# Patient Record
Sex: Female | Born: 1945 | Race: White | Hispanic: No | Marital: Married | Smoking: Current every day smoker
Health system: Southern US, Community
[De-identification: ages and names within clinical notes are randomized; demographics above are authoritative.]

## PROBLEM LIST (undated history)

## (undated) DIAGNOSIS — I10 Essential (primary) hypertension: Secondary | ICD-10-CM

## (undated) DIAGNOSIS — K219 Gastro-esophageal reflux disease without esophagitis: Secondary | ICD-10-CM

## (undated) DIAGNOSIS — G5760 Lesion of plantar nerve, unspecified lower limb: Secondary | ICD-10-CM

## (undated) DIAGNOSIS — R51 Headache: Secondary | ICD-10-CM

## (undated) DIAGNOSIS — M1990 Unspecified osteoarthritis, unspecified site: Secondary | ICD-10-CM

## (undated) HISTORY — PX: TONSILLECTOMY: SUR1361

## (undated) HISTORY — PX: ABDOMINAL HYSTERECTOMY: SHX81

## (undated) HISTORY — PX: CARPAL TUNNEL RELEASE: SHX101

## (undated) HISTORY — PX: LUMBAR PUNCTURE: SHX1985

---

## 1997-11-30 ENCOUNTER — Encounter: Payer: Self-pay | Admitting: Obstetrics and Gynecology

## 1997-11-30 ENCOUNTER — Ambulatory Visit (HOSPITAL_COMMUNITY): Admission: RE | Admit: 1997-11-30 | Discharge: 1997-11-30 | Payer: Self-pay | Admitting: Obstetrics and Gynecology

## 1997-12-09 ENCOUNTER — Ambulatory Visit (HOSPITAL_COMMUNITY): Admission: RE | Admit: 1997-12-09 | Discharge: 1997-12-09 | Payer: Self-pay | Admitting: Obstetrics and Gynecology

## 1998-04-04 ENCOUNTER — Other Ambulatory Visit: Admission: RE | Admit: 1998-04-04 | Discharge: 1998-04-04 | Payer: Self-pay | Admitting: Obstetrics and Gynecology

## 1998-06-23 ENCOUNTER — Ambulatory Visit (HOSPITAL_COMMUNITY): Admission: RE | Admit: 1998-06-23 | Discharge: 1998-06-23 | Payer: Self-pay | Admitting: Obstetrics and Gynecology

## 1998-06-23 ENCOUNTER — Ambulatory Visit (HOSPITAL_BASED_OUTPATIENT_CLINIC_OR_DEPARTMENT_OTHER): Admission: RE | Admit: 1998-06-23 | Discharge: 1998-06-23 | Payer: Self-pay | Admitting: Orthopedic Surgery

## 1998-06-23 ENCOUNTER — Encounter: Payer: Self-pay | Admitting: Obstetrics and Gynecology

## 1998-12-08 HISTORY — PX: OTHER SURGICAL HISTORY: SHX169

## 1999-01-04 ENCOUNTER — Encounter: Payer: Self-pay | Admitting: Obstetrics and Gynecology

## 1999-01-04 ENCOUNTER — Ambulatory Visit (HOSPITAL_COMMUNITY): Admission: RE | Admit: 1999-01-04 | Discharge: 1999-01-04 | Payer: Self-pay | Admitting: Obstetrics and Gynecology

## 1999-01-10 ENCOUNTER — Encounter: Payer: Self-pay | Admitting: Obstetrics and Gynecology

## 1999-01-10 ENCOUNTER — Ambulatory Visit (HOSPITAL_COMMUNITY): Admission: RE | Admit: 1999-01-10 | Discharge: 1999-01-10 | Payer: Self-pay | Admitting: Obstetrics and Gynecology

## 1999-03-16 ENCOUNTER — Ambulatory Visit (HOSPITAL_BASED_OUTPATIENT_CLINIC_OR_DEPARTMENT_OTHER): Admission: RE | Admit: 1999-03-16 | Discharge: 1999-03-17 | Payer: Self-pay | Admitting: Orthopedic Surgery

## 1999-05-31 ENCOUNTER — Other Ambulatory Visit: Admission: RE | Admit: 1999-05-31 | Discharge: 1999-05-31 | Payer: Self-pay | Admitting: Obstetrics and Gynecology

## 2000-01-24 ENCOUNTER — Encounter: Payer: Self-pay | Admitting: Obstetrics and Gynecology

## 2000-01-24 ENCOUNTER — Ambulatory Visit (HOSPITAL_COMMUNITY): Admission: RE | Admit: 2000-01-24 | Discharge: 2000-01-24 | Payer: Self-pay | Admitting: Obstetrics and Gynecology

## 2000-07-08 ENCOUNTER — Other Ambulatory Visit: Admission: RE | Admit: 2000-07-08 | Discharge: 2000-07-08 | Payer: Self-pay | Admitting: Obstetrics and Gynecology

## 2001-02-10 ENCOUNTER — Ambulatory Visit (HOSPITAL_COMMUNITY): Admission: RE | Admit: 2001-02-10 | Discharge: 2001-02-10 | Payer: Self-pay | Admitting: Obstetrics and Gynecology

## 2001-02-10 ENCOUNTER — Encounter: Payer: Self-pay | Admitting: Obstetrics and Gynecology

## 2001-07-28 ENCOUNTER — Encounter: Payer: Self-pay | Admitting: Endocrinology

## 2001-07-28 ENCOUNTER — Encounter: Admission: RE | Admit: 2001-07-28 | Discharge: 2001-07-28 | Payer: Self-pay | Admitting: Endocrinology

## 2001-09-22 ENCOUNTER — Other Ambulatory Visit: Admission: RE | Admit: 2001-09-22 | Discharge: 2001-09-22 | Payer: Self-pay | Admitting: Obstetrics and Gynecology

## 2002-05-27 ENCOUNTER — Encounter: Payer: Self-pay | Admitting: Internal Medicine

## 2002-05-27 ENCOUNTER — Encounter: Admission: RE | Admit: 2002-05-27 | Discharge: 2002-05-27 | Payer: Self-pay | Admitting: Internal Medicine

## 2002-06-16 ENCOUNTER — Encounter: Payer: Self-pay | Admitting: Obstetrics and Gynecology

## 2002-06-16 ENCOUNTER — Ambulatory Visit (HOSPITAL_COMMUNITY): Admission: RE | Admit: 2002-06-16 | Discharge: 2002-06-16 | Payer: Self-pay | Admitting: Internal Medicine

## 2002-12-16 ENCOUNTER — Ambulatory Visit (HOSPITAL_COMMUNITY): Admission: RE | Admit: 2002-12-16 | Discharge: 2002-12-16 | Payer: Self-pay | Admitting: Neurology

## 2003-07-27 ENCOUNTER — Ambulatory Visit (HOSPITAL_COMMUNITY): Admission: RE | Admit: 2003-07-27 | Discharge: 2003-07-27 | Payer: Self-pay | Admitting: Obstetrics and Gynecology

## 2003-09-30 ENCOUNTER — Other Ambulatory Visit: Admission: RE | Admit: 2003-09-30 | Discharge: 2003-09-30 | Payer: Self-pay | Admitting: Obstetrics and Gynecology

## 2004-12-03 ENCOUNTER — Ambulatory Visit (HOSPITAL_COMMUNITY): Admission: RE | Admit: 2004-12-03 | Discharge: 2004-12-03 | Payer: Self-pay | Admitting: Obstetrics and Gynecology

## 2005-03-14 ENCOUNTER — Emergency Department (HOSPITAL_COMMUNITY): Admission: EM | Admit: 2005-03-14 | Discharge: 2005-03-14 | Payer: Self-pay | Admitting: Emergency Medicine

## 2005-08-07 ENCOUNTER — Encounter: Payer: Self-pay | Admitting: Obstetrics and Gynecology

## 2005-10-30 ENCOUNTER — Ambulatory Visit (HOSPITAL_BASED_OUTPATIENT_CLINIC_OR_DEPARTMENT_OTHER): Admission: RE | Admit: 2005-10-30 | Discharge: 2005-10-30 | Payer: Self-pay | Admitting: Orthopedic Surgery

## 2006-01-15 ENCOUNTER — Ambulatory Visit (HOSPITAL_COMMUNITY): Admission: RE | Admit: 2006-01-15 | Discharge: 2006-01-15 | Payer: Self-pay | Admitting: Obstetrics and Gynecology

## 2007-01-20 ENCOUNTER — Ambulatory Visit (HOSPITAL_COMMUNITY): Admission: RE | Admit: 2007-01-20 | Discharge: 2007-01-20 | Payer: Self-pay | Admitting: Obstetrics and Gynecology

## 2008-04-19 ENCOUNTER — Ambulatory Visit (HOSPITAL_COMMUNITY): Admission: RE | Admit: 2008-04-19 | Discharge: 2008-04-19 | Payer: Self-pay | Admitting: Obstetrics and Gynecology

## 2008-08-24 ENCOUNTER — Ambulatory Visit (HOSPITAL_COMMUNITY): Admission: RE | Admit: 2008-08-24 | Discharge: 2008-08-24 | Payer: Self-pay | Admitting: Chiropractic Medicine

## 2009-05-03 ENCOUNTER — Ambulatory Visit (HOSPITAL_COMMUNITY): Admission: RE | Admit: 2009-05-03 | Discharge: 2009-05-03 | Payer: Self-pay | Admitting: Obstetrics and Gynecology

## 2009-05-10 ENCOUNTER — Encounter: Admission: RE | Admit: 2009-05-10 | Discharge: 2009-05-10 | Payer: Self-pay | Admitting: Obstetrics and Gynecology

## 2010-05-10 ENCOUNTER — Other Ambulatory Visit (HOSPITAL_COMMUNITY): Payer: Self-pay | Admitting: Obstetrics and Gynecology

## 2010-05-10 DIAGNOSIS — Z139 Encounter for screening, unspecified: Secondary | ICD-10-CM

## 2010-05-10 DIAGNOSIS — Z1231 Encounter for screening mammogram for malignant neoplasm of breast: Secondary | ICD-10-CM

## 2010-06-04 ENCOUNTER — Ambulatory Visit (HOSPITAL_COMMUNITY)
Admission: RE | Admit: 2010-06-04 | Discharge: 2010-06-04 | Disposition: A | Discharge status: Home / Self Care | Payer: MEDICARE | Source: Ambulatory Visit | Attending: Obstetrics and Gynecology | Admitting: Obstetrics and Gynecology

## 2010-06-04 ENCOUNTER — Encounter (HOSPITAL_COMMUNITY): Payer: Self-pay

## 2010-06-04 DIAGNOSIS — Z1231 Encounter for screening mammogram for malignant neoplasm of breast: Secondary | ICD-10-CM | POA: Insufficient documentation

## 2010-08-24 NOTE — Op Note (Signed)
   NAME:  Tanya Bates, Tanya Bates                     ACCOUNT NO.:  [id number]   MEDICAL RECORD NO.:  [id number]                   PATIENT TYPE:  OUT   LOCATION:  [facility]                                  FACILITY:  [facility]   PHYSICIAN:  Genene Churn. Love, M.D.                 DATE OF BIRTH:  Nov 22, 1945   DATE OF PROCEDURE:  12/16/2002  DATE OF DISCHARGE:                                 OPERATIVE REPORT   PROCEDURE PERFORMED:  Lumbar puncture.   DESCRIPTION OF PROCEDURE:  The patient was prepped and draped in left  lateral decubitus position using Betadine and 1% Xylocaine.  The L3-L4  interspace was entered without difficulty.  Opening pressure was 100 mmH2O.  Clear, colorless CSF was obtained and sent for VDRL, protein, glucose, cell  count, differential, IgG oligoclonal IgG 1433 protein and beta amyloid 42  protein and tau protein.  The patient tolerated the procedure well.                                               Genene Churn. Sandria Manly, M.D.    JML/MEDQ  D:  12/16/2002  T:  12/17/2002  Job:  [id number]

## 2010-08-24 NOTE — Op Note (Signed)
Tanya Bates, Tanya Bates           ACCOUNT NO.:  [id number]   MEDICAL RECORD NO.:  [id number]          PATIENT TYPE:  AMB   LOCATION:  [facility]                          FACILITY:  [facility]   PHYSICIAN:  Artist Pais. Weingold, M.D.DATE OF BIRTH:  [birth year]   DATE OF PROCEDURE:  DATE OF DISCHARGE:                                 OPERATIVE REPORT   PREOPERATIVE DIAGNOSIS:  Right thumb carpometacarpal arthritis.   POSTOPERATIVE DIAGNOSIS:  Right thumb carpometacarpal arthritis.   PROCEDURE:  Right thumb carpometacarpal suspension plasty with abductor  pollicis longus tendon transfer.   SURGEON:  Artist Pais. Mina Marble, M.D.   ASSISTANT:  None.   ANESTHESIA:  General.   TOURNIQUET TIME:  One hour.   COMPLICATIONS:  None.   OPERATIVE PROCEDURE:  Patient was taken to the operating room, after the  induction of adequate general anesthesia right restraint was prepped in the  usual sterile fashion.  As marked we exsanguinated limb tourniquet was  placed 250 mmHg at this point and time.  Incision was made over the [facility]  joint, right thumb 3 cm in length, skin was incised.  Superficial radial  nerve branches were identified.  The extensor tendon was retracted volarly.  The capsule overlying the CMC joint was __________ incised.  The trapezium  was then removed using curettes, Osteotone's and rongeurs.  After this was  done a [facility] synovectomy was performed.  Next a transosseous canal was made in  the base of the thumb metacarpal using an ul followed by sequential hand  drilling, exiting dorsally 2 cm in the joint line.  After this was done the  APL tendon was identified in the volar aspect of the wound it was then  traced through the first dorsal compartment under the skin bridge to the  musculotendinous junction.  After this was done a second incision was made  at the musculotendinous junction.  A 7th of the APL tendon was incised at  the musculotendinous junction drawing of the distal wound and then  passed  from dorsal lower out the thumb metacarpal wrapped around the flexor cup  radialis tendon twice sutured with 2-0 fiber wire and then to itself  dorsally.  Once this was done the suspension was completed.  The wound was  then thoroughly irrigated.  The capsule around the seem shunt was closed  with 4-0 Vicryl and both incision with 3-0 Prolene subcuticular stitches.  Steri-strips were for sloughs and radial guard splint was applied.  Patient  tolerated procedure well and __________ stable fashion.      Artist Pais Mina Marble, M.D.  Electronically Signed    MAW/MEDQ  D:  10/30/2005  T:  10/31/2005  Job:  [id number]

## 2011-05-02 ENCOUNTER — Other Ambulatory Visit (HOSPITAL_COMMUNITY): Payer: Self-pay | Admitting: Obstetrics and Gynecology

## 2011-05-02 DIAGNOSIS — Z1231 Encounter for screening mammogram for malignant neoplasm of breast: Secondary | ICD-10-CM

## 2011-06-06 ENCOUNTER — Ambulatory Visit (HOSPITAL_COMMUNITY)
Admission: RE | Admit: 2011-06-06 | Discharge: 2011-06-06 | Disposition: A | Discharge status: Home / Self Care | Payer: Medicare Other | Source: Ambulatory Visit | Attending: Obstetrics and Gynecology | Admitting: Obstetrics and Gynecology

## 2011-06-06 DIAGNOSIS — Z1231 Encounter for screening mammogram for malignant neoplasm of breast: Secondary | ICD-10-CM | POA: Insufficient documentation

## 2012-03-17 ENCOUNTER — Other Ambulatory Visit: Payer: Self-pay | Admitting: Neurosurgery

## 2012-04-24 ENCOUNTER — Encounter (HOSPITAL_COMMUNITY): Payer: Self-pay

## 2012-04-24 ENCOUNTER — Encounter (HOSPITAL_COMMUNITY)
Admission: RE | Admit: 2012-04-24 | Discharge: 2012-04-24 | Disposition: A | Discharge status: Home / Self Care | Payer: Medicare Other | Source: Ambulatory Visit | Attending: Neurosurgery | Admitting: Neurosurgery

## 2012-04-24 HISTORY — DX: Gastro-esophageal reflux disease without esophagitis: K21.9

## 2012-04-24 HISTORY — DX: Unspecified osteoarthritis, unspecified site: M19.90

## 2012-04-24 HISTORY — DX: Essential (primary) hypertension: I10

## 2012-04-24 HISTORY — DX: Headache: R51

## 2012-04-24 HISTORY — DX: Lesion of plantar nerve, unspecified lower limb: G57.60

## 2012-04-24 LAB — CBC
HCT: 40.7 % (ref 36.0–46.0)
MCH: 33.7 pg (ref 26.0–34.0)
MCV: 96.7 fL (ref 78.0–100.0)
RBC: 4.21 MIL/uL (ref 3.87–5.11)
RDW: 12.1 % (ref 11.5–15.5)
WBC: 5.1 10*3/uL (ref 4.0–10.5)

## 2012-04-24 LAB — ABO/RH: ABO/RH(D): O POS

## 2012-04-24 LAB — BASIC METABOLIC PANEL
CO2: 22 mEq/L (ref 19–32)
Calcium: 10.5 mg/dL (ref 8.4–10.5)
Chloride: 103 mEq/L (ref 96–112)
Creatinine, Ser: 1.13 mg/dL — ABNORMAL HIGH (ref 0.50–1.10)
Glucose, Bld: 96 mg/dL (ref 70–99)

## 2012-04-24 LAB — TYPE AND SCREEN

## 2012-04-24 LAB — SURGICAL PCR SCREEN: Staphylococcus aureus: NEGATIVE

## 2012-04-24 NOTE — Pre-Procedure Instructions (Signed)
Tanya Bates  04/24/2012   Your procedure is scheduled on:  Monday May 04, 2012  Report to [facility] at 0530 AM.  Call this number if you have problems the morning of surgery: [phone]   Remember:   Do not eat food or drink liquids after midnight.   Take these medicines the morning of surgery with A SIP OF WATER: Amlodipine, Wellbutrin, and Lyrica   Do not wear jewelry, make-up or nail polish.  Do not wear lotions, powders, or perfumes. You may wear deodorant.  Do not shave 48 hours prior to surgery.   Do not bring valuables to the hospital.  Contacts, dentures or bridgework may not be worn into surgery.  Leave suitcase in the car. After surgery it may be brought to your room.  For patients admitted to the hospital, checkout time is 11:00 AM the day of  discharge.   Patients discharged the day of surgery will not be allowed to drive  home.    Special Instructions: Shower using CHG 2 nights before surgery and the night before surgery.  If you shower the day of surgery use CHG.  Use special wash - you have one bottle of CHG for all showers.  You should use approximately 1/3 of the bottle for each shower.   Please read over the following fact sheets that you were given: Pain Booklet, Coughing and Deep Breathing, MRSA Information and Surgical Site Infection Prevention

## 2012-05-03 MED ORDER — CEFAZOLIN SODIUM-DEXTROSE 2-3 GM-% IV SOLR
2.0000 g | INTRAVENOUS | Status: DC
  Filled 2012-05-03: qty 50

## 2012-05-04 ENCOUNTER — Inpatient Hospital Stay (HOSPITAL_COMMUNITY): Payer: Medicare Other

## 2012-05-04 ENCOUNTER — Inpatient Hospital Stay (HOSPITAL_COMMUNITY): Payer: Medicare Other | Admitting: Certified Registered"

## 2012-05-04 ENCOUNTER — Encounter (HOSPITAL_COMMUNITY): Payer: Self-pay | Admitting: Certified Registered"

## 2012-05-04 ENCOUNTER — Encounter (HOSPITAL_COMMUNITY): Payer: Self-pay | Admitting: General Practice

## 2012-05-04 ENCOUNTER — Encounter (HOSPITAL_COMMUNITY)
Admission: RE | Disposition: A | Discharge status: Home / Self Care | Payer: Self-pay | Source: Ambulatory Visit | Attending: Neurosurgery

## 2012-05-04 ENCOUNTER — Inpatient Hospital Stay (HOSPITAL_COMMUNITY)
Admission: RE | Admit: 2012-05-04 | Discharge: 2012-05-06 | DRG: 460 | Disposition: A | Discharge status: Home / Self Care | Payer: Medicare Other | Source: Ambulatory Visit | Attending: Neurosurgery | Admitting: Neurosurgery

## 2012-05-04 DIAGNOSIS — Z79899 Other long term (current) drug therapy: Secondary | ICD-10-CM

## 2012-05-04 DIAGNOSIS — M48061 Spinal stenosis, lumbar region without neurogenic claudication: Secondary | ICD-10-CM | POA: Diagnosis present

## 2012-05-04 DIAGNOSIS — G5760 Lesion of plantar nerve, unspecified lower limb: Secondary | ICD-10-CM | POA: Diagnosis present

## 2012-05-04 DIAGNOSIS — Q762 Congenital spondylolisthesis: Principal | ICD-10-CM

## 2012-05-04 DIAGNOSIS — I10 Essential (primary) hypertension: Secondary | ICD-10-CM | POA: Diagnosis present

## 2012-05-04 DIAGNOSIS — F17200 Nicotine dependence, unspecified, uncomplicated: Secondary | ICD-10-CM | POA: Diagnosis present

## 2012-05-04 DIAGNOSIS — M2489 Other specific joint derangement of other specified joint, not elsewhere classified: Secondary | ICD-10-CM | POA: Diagnosis present

## 2012-05-04 DIAGNOSIS — M129 Arthropathy, unspecified: Secondary | ICD-10-CM | POA: Diagnosis present

## 2012-05-04 DIAGNOSIS — K219 Gastro-esophageal reflux disease without esophagitis: Secondary | ICD-10-CM | POA: Diagnosis present

## 2012-05-04 HISTORY — PX: LUMBAR LAMINECTOMY: SHX95

## 2012-05-04 SURGERY — POSTERIOR LUMBAR FUSION 1 LEVEL
Anesthesia: General | Site: Spine Lumbar | Wound class: Clean

## 2012-05-04 MED ORDER — HYDROMORPHONE HCL PF 1 MG/ML IJ SOLN
INTRAMUSCULAR | Status: AC
  Filled 2012-05-04: qty 1

## 2012-05-04 MED ORDER — OXYCODONE HCL 5 MG PO TABS
5.0000 mg | ORAL_TABLET | Freq: Once | ORAL | Status: AC | PRN
  Administered 2012-05-04: 5 mg via ORAL

## 2012-05-04 MED ORDER — EPHEDRINE SULFATE 50 MG/ML IJ SOLN
INTRAMUSCULAR | Status: DC | PRN
  Administered 2012-05-04 (×2): 10 mg via INTRAVENOUS

## 2012-05-04 MED ORDER — CYCLOBENZAPRINE HCL 10 MG PO TABS
10.0000 mg | ORAL_TABLET | Freq: Three times a day (TID) | ORAL | Status: DC | PRN
  Administered 2012-05-04 – 2012-05-06 (×2): 10 mg via ORAL
  Filled 2012-05-04: qty 1

## 2012-05-04 MED ORDER — 0.9 % SODIUM CHLORIDE (POUR BTL) OPTIME
TOPICAL | Status: DC | PRN
  Administered 2012-05-04: 1000 mL

## 2012-05-04 MED ORDER — DEXAMETHASONE SODIUM PHOSPHATE 10 MG/ML IJ SOLN: 10.0000 mg | INTRAMUSCULAR | Status: DC

## 2012-05-04 MED ORDER — NEOSTIGMINE METHYLSULFATE 1 MG/ML IJ SOLN
INTRAMUSCULAR | Status: DC | PRN
  Administered 2012-05-04: 3 mg via INTRAVENOUS

## 2012-05-04 MED ORDER — PHENYLEPHRINE HCL 10 MG/ML IJ SOLN
INTRAMUSCULAR | Status: DC | PRN
  Administered 2012-05-04 (×5): 80 ug via INTRAVENOUS

## 2012-05-04 MED ORDER — ATORVASTATIN CALCIUM 20 MG PO TABS
20.0000 mg | ORAL_TABLET | Freq: Every day | ORAL | Status: DC
  Administered 2012-05-04 – 2012-05-05 (×2): 20 mg via ORAL
  Filled 2012-05-04 (×3): qty 1

## 2012-05-04 MED ORDER — ONDANSETRON HCL 4 MG/2ML IJ SOLN: 4.0000 mg | Freq: Four times a day (QID) | INTRAMUSCULAR | Status: DC | PRN

## 2012-05-04 MED ORDER — CYCLOBENZAPRINE HCL 10 MG PO TABS
ORAL_TABLET | ORAL | Status: AC
  Filled 2012-05-04: qty 1

## 2012-05-04 MED ORDER — OXYCODONE HCL 5 MG PO TABS
ORAL_TABLET | ORAL | Status: AC
  Filled 2012-05-04: qty 1

## 2012-05-04 MED ORDER — SODIUM CHLORIDE 0.9 % IV SOLN
250.0000 mL | INTRAVENOUS | Status: DC
  Administered 2012-05-04: 250 mL via INTRAVENOUS

## 2012-05-04 MED ORDER — CEFAZOLIN SODIUM 1-5 GM-% IV SOLN: 1.0000 g | Freq: Three times a day (TID) | INTRAVENOUS | Status: DC

## 2012-05-04 MED ORDER — ONDANSETRON HCL 4 MG/2ML IJ SOLN
INTRAMUSCULAR | Status: DC | PRN
  Administered 2012-05-04: 4 mg via INTRAVENOUS

## 2012-05-04 MED ORDER — OXYCODONE HCL 5 MG/5ML PO SOLN: 5.0000 mg | Freq: Once | ORAL | Status: AC | PRN

## 2012-05-04 MED ORDER — ACETAMINOPHEN 10 MG/ML IV SOLN
1000.0000 mg | Freq: Once | INTRAVENOUS | Status: AC
  Administered 2012-05-04: 1000 mg via INTRAVENOUS
  Filled 2012-05-04: qty 100

## 2012-05-04 MED ORDER — AMLODIPINE BESYLATE 5 MG PO TABS
5.0000 mg | ORAL_TABLET | Freq: Every day | ORAL | Status: DC
  Administered 2012-05-05: 5 mg via ORAL
  Filled 2012-05-04 (×2): qty 1

## 2012-05-04 MED ORDER — LIDOCAINE HCL (CARDIAC) 20 MG/ML IV SOLN
INTRAVENOUS | Status: DC | PRN
  Administered 2012-05-04: 70 mg via INTRAVENOUS

## 2012-05-04 MED ORDER — WHITE PETROLATUM GEL
Status: AC
  Administered 2012-05-04: 13:00:00
  Filled 2012-05-04: qty 5

## 2012-05-04 MED ORDER — ACETAMINOPHEN 325 MG PO TABS: 650.0000 mg | ORAL_TABLET | ORAL | Status: DC | PRN

## 2012-05-04 MED ORDER — SUFENTANIL CITRATE 50 MCG/ML IV SOLN
INTRAVENOUS | Status: DC | PRN
  Administered 2012-05-04: 5 ug via INTRAVENOUS
  Administered 2012-05-04: 20 ug via INTRAVENOUS
  Administered 2012-05-04: 10 ug via INTRAVENOUS
  Administered 2012-05-04: 5 ug via INTRAVENOUS

## 2012-05-04 MED ORDER — OXYCODONE-ACETAMINOPHEN 5-325 MG PO TABS
1.0000 | ORAL_TABLET | ORAL | Status: DC | PRN
  Administered 2012-05-05 – 2012-05-06 (×9): 2 via ORAL
  Filled 2012-05-04 (×9): qty 2

## 2012-05-04 MED ORDER — BACITRACIN 50000 UNITS IM SOLR
INTRAMUSCULAR | Status: AC
  Filled 2012-05-04: qty 1

## 2012-05-04 MED ORDER — DEXAMETHASONE SODIUM PHOSPHATE 10 MG/ML IJ SOLN
INTRAMUSCULAR | Status: AC
  Administered 2012-05-04: 10 mg via INTRAVENOUS
  Filled 2012-05-04: qty 1

## 2012-05-04 MED ORDER — BUPROPION HCL ER (SR) 150 MG PO TB12
150.0000 mg | ORAL_TABLET | Freq: Two times a day (BID) | ORAL | Status: DC
  Administered 2012-05-04 – 2012-05-06 (×4): 150 mg via ORAL
  Filled 2012-05-04 (×6): qty 1

## 2012-05-04 MED ORDER — SODIUM CHLORIDE 0.9 % IV SOLN
INTRAVENOUS | Status: AC
  Filled 2012-05-04: qty 500

## 2012-05-04 MED ORDER — HYDROCHLOROTHIAZIDE 12.5 MG PO CAPS
12.5000 mg | ORAL_CAPSULE | Freq: Every day | ORAL | Status: DC
  Administered 2012-05-05: 12.5 mg via ORAL
  Filled 2012-05-04 (×2): qty 1

## 2012-05-04 MED ORDER — VANCOMYCIN HCL 500 MG IV SOLR
500.0000 mg | INTRAVENOUS | Status: DC
  Administered 2012-05-05 – 2012-05-06 (×2): 500 mg via INTRAVENOUS
  Filled 2012-05-04 (×2): qty 500

## 2012-05-04 MED ORDER — SODIUM CHLORIDE 0.9 % IR SOLN
Status: DC | PRN
  Administered 2012-05-04: 09:00:00

## 2012-05-04 MED ORDER — PROPOFOL 10 MG/ML IV BOLUS
INTRAVENOUS | Status: DC | PRN
  Administered 2012-05-04: 140 mg via INTRAVENOUS

## 2012-05-04 MED ORDER — GLYCOPYRROLATE 0.2 MG/ML IJ SOLN
INTRAMUSCULAR | Status: DC | PRN
  Administered 2012-05-04: 0.4 mg via INTRAVENOUS

## 2012-05-04 MED ORDER — PREGABALIN 75 MG PO CAPS
75.0000 mg | ORAL_CAPSULE | Freq: Two times a day (BID) | ORAL | Status: DC
  Administered 2012-05-04 – 2012-05-06 (×5): 75 mg via ORAL
  Filled 2012-05-04 (×5): qty 1

## 2012-05-04 MED ORDER — DOCUSATE SODIUM 100 MG PO CAPS
100.0000 mg | ORAL_CAPSULE | Freq: Two times a day (BID) | ORAL | Status: DC
  Administered 2012-05-04 – 2012-05-05 (×4): 100 mg via ORAL
  Filled 2012-05-04 (×4): qty 1

## 2012-05-04 MED ORDER — BUPIVACAINE HCL (PF) 0.25 % IJ SOLN
INTRAMUSCULAR | Status: DC | PRN
  Administered 2012-05-04: 9 mL

## 2012-05-04 MED ORDER — PHENOL 1.4 % MT LIQD: 1.0000 | OROMUCOSAL | Status: DC | PRN

## 2012-05-04 MED ORDER — ROCURONIUM BROMIDE 100 MG/10ML IV SOLN
INTRAVENOUS | Status: DC | PRN
  Administered 2012-05-04: 20 mg via INTRAVENOUS
  Administered 2012-05-04: 50 mg via INTRAVENOUS

## 2012-05-04 MED ORDER — MENTHOL 3 MG MT LOZG: 1.0000 | LOZENGE | OROMUCOSAL | Status: DC | PRN

## 2012-05-04 MED ORDER — MIDAZOLAM HCL 5 MG/5ML IJ SOLN
INTRAMUSCULAR | Status: DC | PRN
  Administered 2012-05-04: 2 mg via INTRAVENOUS

## 2012-05-04 MED ORDER — HYDROMORPHONE HCL PF 1 MG/ML IJ SOLN
0.5000 mg | INTRAMUSCULAR | Status: DC | PRN
  Administered 2012-05-04 (×5): 1 mg via INTRAVENOUS
  Filled 2012-05-04 (×5): qty 1

## 2012-05-04 MED ORDER — ALBUMIN HUMAN 5 % IV SOLN
INTRAVENOUS | Status: DC | PRN
  Administered 2012-05-04: 09:00:00 via INTRAVENOUS

## 2012-05-04 MED ORDER — ALUM & MAG HYDROXIDE-SIMETH 200-200-20 MG/5ML PO SUSP
30.0000 mL | Freq: Four times a day (QID) | ORAL | Status: DC | PRN
  Administered 2012-05-04: 30 mL via ORAL
  Filled 2012-05-04: qty 30

## 2012-05-04 MED ORDER — LIDOCAINE-EPINEPHRINE 1 %-1:100000 IJ SOLN
INTRAMUSCULAR | Status: DC | PRN
  Administered 2012-05-04: 8 mL

## 2012-05-04 MED ORDER — LACTATED RINGERS IV SOLN
INTRAVENOUS | Status: DC | PRN
  Administered 2012-05-04 (×2): via INTRAVENOUS

## 2012-05-04 MED ORDER — SODIUM CHLORIDE 0.9 % IJ SOLN
3.0000 mL | Freq: Two times a day (BID) | INTRAMUSCULAR | Status: DC
  Administered 2012-05-05 (×2): 3 mL via INTRAVENOUS

## 2012-05-04 MED ORDER — ACETAMINOPHEN 650 MG RE SUPP: 650.0000 mg | RECTAL | Status: DC | PRN

## 2012-05-04 MED ORDER — VANCOMYCIN HCL 1000 MG IV SOLR
1000.0000 mg | INTRAVENOUS | Status: DC | PRN
  Administered 2012-05-04: 1000 mg via INTRAVENOUS

## 2012-05-04 MED ORDER — OLMESARTAN MEDOXOMIL-HCTZ 40-12.5 MG PO TABS: 1.0000 | ORAL_TABLET | Freq: Every day | ORAL | Status: DC

## 2012-05-04 MED ORDER — IRBESARTAN 300 MG PO TABS
300.0000 mg | ORAL_TABLET | Freq: Every day | ORAL | Status: DC
  Administered 2012-05-05 – 2012-05-06 (×2): 300 mg via ORAL
  Filled 2012-05-04 (×2): qty 1

## 2012-05-04 MED ORDER — HYDROMORPHONE HCL PF 1 MG/ML IJ SOLN
0.2500 mg | INTRAMUSCULAR | Status: DC | PRN
  Administered 2012-05-04 (×4): 0.5 mg via INTRAVENOUS

## 2012-05-04 MED ORDER — ONDANSETRON HCL 4 MG/2ML IJ SOLN: 4.0000 mg | INTRAMUSCULAR | Status: DC | PRN

## 2012-05-04 MED ORDER — SODIUM CHLORIDE 0.9 % IJ SOLN: 3.0000 mL | INTRAMUSCULAR | Status: DC | PRN

## 2012-05-04 MED ORDER — VANCOMYCIN HCL IN DEXTROSE 1-5 GM/200ML-% IV SOLN
INTRAVENOUS | Status: AC
  Filled 2012-05-04: qty 200

## 2012-05-04 MED ORDER — ACETAMINOPHEN 10 MG/ML IV SOLN
INTRAVENOUS | Status: AC
  Filled 2012-05-04: qty 100

## 2012-05-04 MED ORDER — THROMBIN 20000 UNITS EX SOLR
CUTANEOUS | Status: DC | PRN
  Administered 2012-05-04: 09:00:00 via TOPICAL

## 2012-05-04 MED ORDER — ACETAMINOPHEN 10 MG/ML IV SOLN
INTRAVENOUS | Status: DC | PRN
  Administered 2012-05-04: 1000 mg via INTRAVENOUS

## 2012-05-04 SURGICAL SUPPLY — 73 items
ADH SKN CLS APL DERMABOND .7 (GAUZE/BANDAGES/DRESSINGS)
ADH SKN CLS LQ APL DERMABOND (GAUZE/BANDAGES/DRESSINGS) ×1
APL SKNCLS STERI-STRIP NONHPOA (GAUZE/BANDAGES/DRESSINGS) ×1
BAG DECANTER FOR FLEXI CONT (MISCELLANEOUS) ×2 IMPLANT
BENZOIN TINCTURE PRP APPL 2/3 (GAUZE/BANDAGES/DRESSINGS) ×2 IMPLANT
BLADE SURG 11 STRL SS (BLADE) ×2 IMPLANT
BLADE SURG ROTATE 9660 (MISCELLANEOUS) IMPLANT
BRUSH SCRUB EZ PLAIN DRY (MISCELLANEOUS) ×2 IMPLANT
BUR MATCHSTICK NEURO 3.0 LAGG (BURR) ×2 IMPLANT
BUR PRECISION FLUTE 6.0 (BURR) ×2 IMPLANT
CANISTER SUCTION 2500CC (MISCELLANEOUS) ×2 IMPLANT
CAP LOCKING REVERE (Cap) ×4 IMPLANT
CLOTH BEACON ORANGE TIMEOUT ST (SAFETY) ×2 IMPLANT
CONT SPEC 4OZ CLIKSEAL STRL BL (MISCELLANEOUS) ×4 IMPLANT
COVER BACK TABLE 24X17X13 BIG (DRAPES) IMPLANT
COVER TABLE BACK 60X90 (DRAPES) ×2 IMPLANT
DECANTER SPIKE VIAL GLASS SM (MISCELLANEOUS) ×2 IMPLANT
DERMABOND ADHESIVE PROPEN (GAUZE/BANDAGES/DRESSINGS) ×1
DERMABOND ADVANCED (GAUZE/BANDAGES/DRESSINGS)
DERMABOND ADVANCED .7 DNX12 (GAUZE/BANDAGES/DRESSINGS) IMPLANT
DERMABOND ADVANCED .7 DNX6 (GAUZE/BANDAGES/DRESSINGS) IMPLANT
DRAPE C-ARM 42X72 X-RAY (DRAPES) ×4 IMPLANT
DRAPE LAPAROTOMY 100X72X124 (DRAPES) ×2 IMPLANT
DRAPE POUCH INSTRU U-SHP 10X18 (DRAPES) ×2 IMPLANT
DRAPE PROXIMA HALF (DRAPES) IMPLANT
DRAPE SURG 17X23 STRL (DRAPES) ×2 IMPLANT
DRSG OPSITE 4X5.5 SM (GAUZE/BANDAGES/DRESSINGS) ×4 IMPLANT
ELECT REM PT RETURN 9FT ADLT (ELECTROSURGICAL) ×2
ELECTRODE REM PT RTRN 9FT ADLT (ELECTROSURGICAL) ×1 IMPLANT
EVACUATOR 3/16  PVC DRAIN (DRAIN) ×1
EVACUATOR 3/16 PVC DRAIN (DRAIN) ×1 IMPLANT
GAUZE SPONGE 4X4 16PLY XRAY LF (GAUZE/BANDAGES/DRESSINGS) IMPLANT
GLOVE BIO SURGEON STRL SZ8 (GLOVE) ×4 IMPLANT
GLOVE BIOGEL PI IND STRL 7.0 (GLOVE) IMPLANT
GLOVE BIOGEL PI INDICATOR 7.0 (GLOVE) ×4
GLOVE ECLIPSE 7.5 STRL STRAW (GLOVE) IMPLANT
GLOVE ECLIPSE 8.5 STRL (GLOVE) ×1 IMPLANT
GLOVE EXAM NITRILE LRG STRL (GLOVE) IMPLANT
GLOVE EXAM NITRILE MD LF STRL (GLOVE) IMPLANT
GLOVE EXAM NITRILE XL STR (GLOVE) IMPLANT
GLOVE EXAM NITRILE XS STR PU (GLOVE) IMPLANT
GLOVE INDICATOR 8.5 STRL (GLOVE) ×4 IMPLANT
GLOVE SS BIOGEL STRL SZ 6.5 (GLOVE) ×2 IMPLANT
GLOVE SUPERSENSE BIOGEL SZ 6.5 (GLOVE) ×2
GOWN BRE IMP SLV AUR LG STRL (GOWN DISPOSABLE) ×1 IMPLANT
GOWN BRE IMP SLV AUR XL STRL (GOWN DISPOSABLE) ×5 IMPLANT
GOWN STRL REIN 2XL LVL4 (GOWN DISPOSABLE) IMPLANT
KIT BASIN OR (CUSTOM PROCEDURE TRAY) ×2 IMPLANT
KIT ROOM TURNOVER OR (KITS) ×2 IMPLANT
MILL MEDIUM DISP (BLADE) ×2 IMPLANT
NDL HYPO 25X1 1.5 SAFETY (NEEDLE) ×1 IMPLANT
NEEDLE HYPO 25X1 1.5 SAFETY (NEEDLE) ×2 IMPLANT
NS IRRIG 1000ML POUR BTL (IV SOLUTION) ×2 IMPLANT
PACK LAMINECTOMY NEURO (CUSTOM PROCEDURE TRAY) ×2 IMPLANT
PAD ARMBOARD 7.5X6 YLW CONV (MISCELLANEOUS) ×10 IMPLANT
PUTTY 5ML ACTIFUSE ABX (Putty) IMPLANT
PUTTY BONE DBX 5CC MIX (Putty) ×1 IMPLANT
ROD CURVED 5.5X45MM (Rod) ×4 IMPLANT
SCREW PEDICLE 6.5X40MM (Screw) ×4 IMPLANT
SPACER CALIBER 10X22MM 11-15MM (Spacer) ×4 IMPLANT
SPONGE GAUZE 4X4 12PLY (GAUZE/BANDAGES/DRESSINGS) ×2 IMPLANT
SPONGE LAP 4X18 X RAY DECT (DISPOSABLE) IMPLANT
SPONGE SURGIFOAM ABS GEL 100 (HEMOSTASIS) ×2 IMPLANT
STRIP CLOSURE SKIN 1/2X4 (GAUZE/BANDAGES/DRESSINGS) ×4 IMPLANT
SUT VIC AB 0 CT1 18XCR BRD8 (SUTURE) ×2 IMPLANT
SUT VIC AB 0 CT1 8-18 (SUTURE) ×2
SUT VIC AB 2-0 CT1 18 (SUTURE) ×2 IMPLANT
SUT VICRYL 4-0 PS2 18IN ABS (SUTURE) ×2 IMPLANT
SYR 20ML ECCENTRIC (SYRINGE) ×2 IMPLANT
TOWEL OR 17X24 6PK STRL BLUE (TOWEL DISPOSABLE) ×2 IMPLANT
TOWEL OR 17X26 10 PK STRL BLUE (TOWEL DISPOSABLE) ×2 IMPLANT
TRAY FOLEY CATH 14FRSI W/METER (CATHETERS) ×2 IMPLANT
WATER STERILE IRR 1000ML POUR (IV SOLUTION) ×2 IMPLANT

## 2012-05-04 NOTE — Op Note (Signed)
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 with severe foraminal stenosis of the L4 and L5 nerve roots bilaterally  Postoperative diagnosis: Same  Procedure: Decompressive lumbar laminectomy L4-5 with more work than would be needed with a standard interbody fusion  #2 posterior lumbar interbody fusion L4-5 using the globus caliber expandable peek cages packed with local autograft mixed with DBX  #3 pedicle screw fixation L4-5 using the globus Revere 5.5 pedicle screw system  #4 posterior lateral arthrodesis L4-5 using local autograft mixed with DBX  #5 open reduction spinal deformity  #6 placement of a medium large Hemovac drain  Surgeon: Jillyn Hidden Jette Lewan  Assistant: Sherilyn Cooter pool  Anesthesia: Gen.  EBL: Minimal  History of present illness: Patient is a 67 year old female is a progress worsening back pain and workup has revealed I. instability L4-5 with a grade 1 spondylolisthesis with movement on flexion extension films. Patient failed all forms of conservative treatment due to her imaging findings progression of clinical syndrome failed conservative treatment she was recommended decompression stabilization procedure extensive with odorous and benefits of the operation with her as well as perioperative course and expectations about alternatives of surgery she understood and agreed to proceed forward.  Operative procedure: Patient was brought into the or was induced under general anesthesia positioned prone the Wilson frame her back was prepped and draped in routine sterile fashion preoperative x-ray localize the appropriate level so after infiltration of 8 cc lidocaine with epi a midline incision was made and Bovie cautery was used taken the subcutaneous tissue subperiosteal dissections care lamina of L3-L4 and L5 exposing the TPS L4 and L5 bilaterally interoperative X. identify the L4 TPN pedicle so initially there was a disruption of the interspinous ligament  visualized immediately upon exposure the spinolaminar complexes at L4-5 was markedly hypermobile with dense amount of inflammatory tissue around the bilateral pars defects she had. Then Senokot spinous processes removed L4 central decompression was begun complete central decompression was performed and complete medial facetectomies were performed. There was a dense amount of adhesion especially on the patient's left side around the TPS and around the thecal sac where the medial facet complexes overgrown with a lot of epidural fibrosis. His is all dissected away and removed in piecemeal fashion radical foraminotomies were performed of the L4 and L5 nerve root aggressive abutting of the superior tickling process of L5 allowed access lateral margins disc space and also allowed adequate decompression of the L4 nerve root. After the decompression been completed consisting into the pedicle to placement using a high-speed drill pilot holes were drilled L4 carried the awl probed O55 tap probed again and a 6.5 by 40 mm pedicle screw was placed. Fluoroscopy was used the step along the way as well as internal and external bony landmarks in a similar fashion a 6.5 x 40 mm screws inserted L5 and the right and 6.5 x 40 was also inserted L4 and L5 on the left the after all screws in place attention second the interbody work pulled this dose size of the space were cleaned out and incised a size 11 distractor was inserted the patient's left side a size 9 rotating cutter was used to clean out the disc space on the right Epstein curettes and pituitary rongeurs the LAD adequate endplate preparation disc space cleanout. The size 11 distractor partially reduced the deformity and spondylolisthesis. Placement of an 11 mm x 22 mm expandable peek cage expanded up 6 turns for approximately 14-15 mm completed the reduction burning  both the L4 and L3 the right vertebral body back and alignment. Then the distractor was removed fluoroscopy again  confirmed a simple the way adequate positioning then the disc space endplates  The left side local autograft was packed centrally and laterally and another cage was inserted the patient's left side and expanded a similar dimensions. After this on postop AP and lateral fluoroscopy confirmed good position of all the implants the wound scope was irrigated meticulous hemostasis was maintained aggressive decortication was care MTPs or lateral gutters local are graft pack posterior laterally then 2 rods were placed top tightness to down L5 the L4 screw is partially compressed against L5 and then all the foramina were then reexplored confirm patency Gelfoam was overlaid top of the dura a large abductor was placed and the wounds closed in layers with after Vicryl and a running 4 subcuticular in the skin benzoin Steri-Strips applied patient recovered in stable condition. At the end of case on it counts and sponge counts were correct.

## 2012-05-04 NOTE — Transfer of Care (Signed)
Immediate Anesthesia Transfer of Care Note  Patient: Tanya Bates  Procedure(s) Performed: Procedure(s) (LRB) with comments: POSTERIOR LUMBAR FUSION 1 LEVEL (N/A) - Lumbar Four-Five Posterior Lumbar Interbody Fusion with Pedicle Screws  Patient Location: PACU  Anesthesia Type:General  Level of Consciousness: awake, alert  and oriented  Airway & Oxygen Therapy: Patient Spontanous Breathing and Patient connected to nasal cannula oxygen  Post-op Assessment: Report given to PACU RN, Post -op Vital signs reviewed and stable and Patient moving all extremities  Post vital signs: Reviewed and stable  Complications: No apparent anesthesia complications

## 2012-05-04 NOTE — Preoperative (Signed)
Beta Blockers   Reason not to administer Beta Blockers:Not Applicable 

## 2012-05-04 NOTE — Progress Notes (Signed)
ANTIBIOTIC CONSULT NOTE - INITIAL  Pharmacy Consult for Vanco Indication: Post-op spinal sugery with drain  Allergies  Allergen Reactions  . Penicillins Swelling    Patient Measurements: Height: 5\' 1"  (154.9 cm) Weight: 118 lb (53.524 kg) (from preadmit 04/24/12) IBW/kg (Calculated) : 47.8  Adjusted Body Weight: 53.5 kg  Vital Signs: Temp: 98 F (36.7 C) (01/27 1300) BP: 124/69 mmHg (01/27 1300) Pulse Rate: 86  (01/27 1300) Intake/Output from previous day:   Intake/Output from this shift: Total I/O In: 2074 [I.V.:1800; Other:24; IV Piggyback:250] Out: 600 [Urine:275; Drains:25; Blood:300]  Labs: No results found for this basename: WBC:3,HGB:3,PLT:3,LABCREA:3,CREATININE:3 in the last 72 hours Estimated Creatinine Clearance: 36.5 ml/min (by C-G formula based on Cr of 1.13). No results found for this basename: VANCOTROUGH:2,VANCOPEAK:2,VANCORANDOM:2,GENTTROUGH:2,GENTPEAK:2,GENTRANDOM:2,TOBRATROUGH:2,TOBRAPEAK:2,TOBRARND:2,AMIKACINPEAK:2,AMIKACINTROU:2,AMIKACIN:2, in the last 72 hours   Microbiology: Recent Results (from the past 720 hour(s))  SURGICAL PCR SCREEN     Status: Normal   Collection Time   04/24/12  9:51 AM      Component Value Range Status Comment   MRSA, PCR NEGATIVE  NEGATIVE Final    Staphylococcus aureus NEGATIVE  NEGATIVE Final     Medical History: Past Medical History  Diagnosis Date  . Hypertension   . GERD (gastroesophageal reflux disease)   . Headache     sinus  . Arthritis   . Morton neuroma     between 2nd and third toe    Medications:  Prescriptions prior to admission  Medication Sig Dispense Refill  . amLODipine (NORVASC) 5 MG tablet Take 5 mg by mouth daily.      Marland Kitchen atorvastatin (LIPITOR) 20 MG tablet Take 20 mg by mouth daily.      Marland Kitchen buPROPion (WELLBUTRIN SR) 150 MG 12 hr tablet Take 150 mg by mouth 2 (two) times daily.      . Cholecalciferol (VITAMIN D PO) Take 1 tablet by mouth daily.      . Cyanocobalamin (VITAMIN B 12 PO) Take 1  tablet by mouth daily.      Marland Kitchen olmesartan-hydrochlorothiazide (BENICAR HCT) 40-12.5 MG per tablet Take 1 tablet by mouth daily.      . pregabalin (LYRICA) 75 MG capsule Take 75 mg by mouth 2 (two) times daily.      Marland Kitchen PRESCRIPTION MEDICATION as needed. 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 Dr Charlsie Merles office at the foot center on [address]       Assessment: 67 y/o F with long-standing chronic low back pain failed conservative treatment. 1/27: Decompressive lumbar laminectomy L4-5 with post-op drain remaining.  PMH: HTN, GERD, headaches, arthritis, Morton neuroma (toes)  Goal of Therapy:  Vancomycin trough level 10-15 mcg/ml  Plan:  Vancomycin 1g IV x 1 received pre-op 1/27 0717 Start Vanco 500mg  IV q24h tomorrow am.  Misty Stanley Stillinger 05/04/2012,2:55 PM

## 2012-05-04 NOTE — Anesthesia Postprocedure Evaluation (Signed)
Anesthesia Post Note  Patient: Tanya Bates  Procedure(s) Performed: Procedure(s) (LRB): POSTERIOR LUMBAR FUSION 1 LEVEL (N/A)  Anesthesia type: General  Patient location: PACU  Post pain: Pain level controlled and Adequate analgesia  Post assessment: Post-op Vital signs reviewed, Patient's Cardiovascular Status Stable, Respiratory Function Stable, Patent Airway and Pain level controlled  Last Vitals:  Filed Vitals:   05/04/12 1042  BP:   Pulse: 90  Temp:   Resp: 28    Post vital signs: Reviewed and stable  Level of consciousness: awake, alert  and oriented  Complications: No apparent anesthesia complications

## 2012-05-04 NOTE — Progress Notes (Signed)
Call placed to Dr Wynetta Emery re: pt's allergy to PCN (swelling).  Ancef removed from chart and note placed for holding area nurses.

## 2012-05-04 NOTE — H&P (Signed)
Tanya Bates is an 67 y.o. female.   Chief Complaint: Back pain HPI: Patient is a 67 year old female presents with long-standing chronic low back pain she denies any significant radiating pain into her legs she does have episodic weakness in her legs legs giving way when she is active or walks for long distances. She is failed all forms of conservative treatment with anti-inflammatories physical therapy and injections workup has revealed a grade 1 spondylolisthesis and severe spinal stenosis at L4-5 as well as diastases of the facet joints with fluid in the facet joints at L4-5. Due to patient's failed conservative treatment progression clinical syndrome and imaging findings I recommended decompression stabilization procedure at L4-5 I have extensively reviewed the risks and benefits of the operation as well as perioperative course and expectations of outcome alternatives to surgery and she understands and agrees to proceed forward.  Past Medical History  Diagnosis Date  . Hypertension   . GERD (gastroesophageal reflux disease)   . Headache     sinus  . Arthritis   . Morton neuroma     between 2nd and third toe    Past Surgical History  Procedure Date  . Carpal tunnel release     right  . Lumbar puncture     08/24/10  . Thumb joint removed 2000's    right and left  . Abdominal hysterectomy     partial  . Tonsillectomy     No family history on file. Social History:  reports that she has been smoking Cigarettes.  She has a 11.25 pack-year smoking history. She has never used smokeless tobacco. She reports that she drinks alcohol. She reports that she does not use illicit drugs.  Allergies:  Allergies  Allergen Reactions  . Penicillins Swelling    Medications Prior to Admission  Medication Sig Dispense Refill  . amLODipine (NORVASC) 5 MG tablet Take 5 mg by mouth daily.      Marland Kitchen atorvastatin (LIPITOR) 20 MG tablet Take 20 mg by mouth daily.      Marland Kitchen buPROPion (WELLBUTRIN SR)  150 MG 12 hr tablet Take 150 mg by mouth 2 (two) times daily.      . Cholecalciferol (VITAMIN D PO) Take 1 tablet by mouth daily.      . Cyanocobalamin (VITAMIN B 12 PO) Take 1 tablet by mouth daily.      Marland Kitchen olmesartan-hydrochlorothiazide (BENICAR HCT) 40-12.5 MG per tablet Take 1 tablet by mouth daily.      . pregabalin (LYRICA) 75 MG capsule Take 75 mg by mouth 2 (two) times daily.      Marland Kitchen PRESCRIPTION MEDICATION as needed. 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 Dr Charlsie Merles office at the foot center on [address]        No results found for this or any previous visit (from the past 48 hour(s)). No results found.  Review of Systems  Constitutional: Negative.   HENT: Negative.   Eyes: Negative.   Respiratory: Negative.   Cardiovascular: Negative.   Gastrointestinal: Negative.   Genitourinary: Negative.   Musculoskeletal: Positive for myalgias, back pain and joint pain.  Skin: Negative.   Neurological: Negative.   Endo/Heme/Allergies: Negative.   Psychiatric/Behavioral: Negative.     There were no vitals taken for this visit. Physical Exam  Constitutional: She is oriented to person, place, and time. She appears well-developed and well-nourished.  HENT:  Head: Normocephalic.  Eyes: Pupils are equal, round, and  reactive to light.  Neck: Normal range of motion.  Cardiovascular: Normal rate.   Respiratory: Effort normal.  GI: Soft.  Musculoskeletal: Normal range of motion.  Neurological: She is alert and oriented to person, place, and time. She has normal strength. GCS eye subscore is 4. GCS verbal subscore is 5. GCS motor subscore is 6.  Reflex Scores:      Tricep reflexes are 2+ on the right side and 2+ on the left side.      Bicep reflexes are 2+ on the right side and 2+ on the left side.      Brachioradialis reflexes are 2+ on the right side and 2+ on the left side.      Patellar reflexes are 1+ on the right side and 1+  on the left side.      Achilles reflexes are 0 on the right side and 0 on the left side.      Strength is 5 out of 5 in her iliopsoas, quads, his hamstrings, gastrocs, anterior tibialis, and EHL.     Assessment/Plan 67 year female presents for an L4-5 decompression stabilization procedure  Tanya Bates P 05/04/2012, 7:03 AM

## 2012-05-04 NOTE — Anesthesia Preprocedure Evaluation (Addendum)
Anesthesia Evaluation  Patient identified by MRN, date of birth, ID band Patient awake    Reviewed: Allergy & Precautions, H&P , NPO status , Patient's Chart, lab work & pertinent test results  Airway Mallampati: II TM Distance: >3 FB Neck ROM: full    Dental  (+) Teeth Intact and Partial Lower   Pulmonary Current Smoker,          Cardiovascular hypertension,     Neuro/Psych  Headaches,    GI/Hepatic GERD-  ,  Endo/Other    Renal/GU      Musculoskeletal  (+) Arthritis -,   Abdominal   Peds  Hematology   Anesthesia Other Findings   Reproductive/Obstetrics                          Anesthesia Physical Anesthesia Plan  ASA: II  Anesthesia Plan: General   Post-op Pain Management:    Induction: Intravenous  Airway Management Planned: Oral ETT  Additional Equipment:   Intra-op Plan:   Post-operative Plan: Extubation in OR  Informed Consent: I have reviewed the patients History and Physical, chart, labs and discussed the procedure including the risks, benefits and alternatives for the proposed anesthesia with the patient or authorized representative who has indicated his/her understanding and acceptance.   Dental advisory given  Plan Discussed with: CRNA, Surgeon and Anesthesiologist  Anesthesia Plan Comments:        Anesthesia Quick Evaluation

## 2012-05-05 MED FILL — Sodium Chloride IV Soln 0.9%: INTRAVENOUS | Qty: 1000 | Status: AC

## 2012-05-05 MED FILL — Heparin Sodium (Porcine) Inj 1000 Unit/ML: INTRAMUSCULAR | Qty: 30 | Status: AC

## 2012-05-05 NOTE — Evaluation (Signed)
Physical Therapy Evaluation Patient Details Name: Tanya Bates MRN: [id number] DOB: 1945-07-29 Today's Date: 05/05/2012 Time: 4098-1191 PT Time Calculation (min): 27 min  PT Assessment / Plan / Recommendation Clinical Impression  Pt is a 67 yo female s/p PLF x 1 level mobilizing very well. Pt safe to d/c home with spouse upon MD"s approval. Pt does not need any DME with exception of shower chair however pt reports her shower is to small. Acute PT to con't to follow to maximize safety with transfers and adherence to back precautions.    PT Assessment  Patient needs continued PT services    Follow Up Recommendations  No PT follow up;Supervision - Intermittent    Does the patient have the potential to tolerate intense rehabilitation      Barriers to Discharge None      Equipment Recommendations   (shower chair - provided pt with pictures for spouse to investigate to see if they would fit in shower)    Recommendations for Other Services     Frequency Min 5X/week    Precautions / Restrictions Precautions Precautions: Back Precaution Booklet Issued: Yes (comment) Precaution Comments: pt with verbal understanding Required Braces or Orthoses: Spinal Brace Spinal Brace: Lumbar corset Restrictions Weight Bearing Restrictions: No   Pertinent Vitals/Pain 5/10 surgical back pain      Mobility  Bed Mobility Bed Mobility: Rolling Left;Left Sidelying to Sit;Sit to Sidelying Left Rolling Left: 5: Supervision Left Sidelying to Sit: 5: Supervision;HOB flat Sit to Sidelying Left: 5: Supervision;HOB flat Details for Bed Mobility Assistance: max directional v/c's for hand placement and to adhere to back precautions Transfers Transfers: Sit to Stand;Stand to Sit Sit to Stand: 4: Min guard;With upper extremity assist;From bed Stand to Sit: 4: Min guard;With armrests;With upper extremity assist;To chair/3-in-1 Details for Transfer Assistance: guarded, v/c's to limit trunk  flex Ambulation/Gait Ambulation/Gait Assistance: 4: Min guard Ambulation Distance (Feet): 150 Feet Assistive device: None Ambulation/Gait Assistance Details: no episodes of LOB, mild trunk flexion Gait Pattern: Step-through pattern;Decreased stride length;Wide base of support Gait velocity: wfl Stairs: Yes Stairs Assistance: 4: Min guard Stair Management Technique: One rail Right Number of Stairs: 4     Shoulder Instructions     Exercises     PT Diagnosis: Difficulty walking;Acute pain  PT Problem List: Decreased activity tolerance;Decreased balance;Decreased mobility PT Treatment Interventions: Gait training;Stair training;Functional mobility training   PT Goals Acute Rehab PT Goals PT Goal Formulation: With patient Time For Goal Achievement: 05/12/12 Potential to Achieve Goals: Good Pt will Roll Supine to Left Side: with modified independence PT Goal: Rolling Supine to Left Side - Progress: Goal set today Pt will go Supine/Side to Sit: with modified independence;with HOB 0 degrees PT Goal: Supine/Side to Sit - Progress: Goal set today Pt will go Sit to Supine/Side: with modified independence;with HOB 0 degrees PT Goal: Sit to Supine/Side - Progress: Goal set today Pt will go Sit to Stand: with modified independence;with upper extremity assist PT Goal: Sit to Stand - Progress: Goal set today Pt will Ambulate: >150 feet;with modified independence (no device) PT Goal: Ambulate - Progress: Goal set today Pt will Go Up / Down Stairs: Flight;with supervision;with rail(s) PT Goal: Up/Down Stairs - Progress: Goal set today Additional Goals Additional Goal #1: Pt independent with recall of 3/3 back precautions and 100% compliance. PT Goal: Additional Goal #1 - Progress: Goal set today  Visit Information  Last PT Received On: 05/05/12 Assistance Needed: +1    Subjective Data  Subjective: Pt  received supine in bed agreeable to PT. Patient Stated Goal: home   Prior  Functioning  Home Living Lives With: Spouse Available Help at Discharge: Family;Available 24 hours/day Type of Home: House Home Access: Stairs to enter [employer] of Steps: 1 Entrance Stairs-Rails: None Home Layout: Two level Alternate Level Stairs-Number of Steps: 15 Alternate Level Stairs-Rails: Left Bathroom Shower/Tub: Tub/shower unit;Walk-in shower Bathroom Toilet: Handicapped height Bathroom Accessibility: No Home Adaptive Equipment: None Additional Comments: pt reports she uses the handicapped toliet most of the time, and walk in shower is extremely small so unlikey she will be able to fit shower chair Prior Function Level of Independence: Independent Able to Take Stairs?: Yes Driving: Yes Vocation: Retired Musician: No difficulties Dominant Hand: Right    Cognition  Overall Cognitive Status: Appears within functional limits for tasks assessed/performed Arousal/Alertness: Awake/alert Orientation Level: Oriented X4 / Intact Behavior During Session: [facility] for tasks performed    Extremity/Trunk Assessment Right Upper Extremity Assessment RUE ROM/Strength/Tone: WFL for tasks assessed RUE Sensation: WFL - Light Touch Left Upper Extremity Assessment LUE ROM/Strength/Tone: Within functional levels LUE Sensation: WFL - Light Touch Right Lower Extremity Assessment RLE ROM/Strength/Tone: Within functional levels RLE Sensation: WFL - Light Touch Left Lower Extremity Assessment LLE ROM/Strength/Tone: Within functional levels LLE Sensation: WFL - Light Touch Trunk Assessment Trunk Assessment: Normal   Balance    End of Session PT - End of Session Equipment Utilized During Treatment: Gait belt;Back brace Activity Tolerance: Patient tolerated treatment well Patient left: in bed;with call bell/phone within reach Nurse Communication: Mobility status  GP     Marcene Brawn 05/05/2012, 2:13 PM  Lewis Shock, PT, DPT Pager #:  [phone] Office #: [phone]

## 2012-05-05 NOTE — Progress Notes (Signed)
Subjective: Patient reports She's doing better no leg pain back pain is well controlled on Percocet  Objective: Vital signs in last 24 hours: Temp:  [97.7 F (36.5 C)-98.6 F (37 C)] 98 F (36.7 C) (01/28 0700) Pulse Rate:  [80-97] 81  (01/28 0700) Resp:  [11-31] 18  (01/28 0700) BP: (99-132)/(50-81) 132/71 mmHg (01/28 0700) SpO2:  [85 %-97 %] 96 % (01/28 0700) Weight:  [53.524 kg (118 lb)] 53.524 kg (118 lb) (01/27 1300)  Intake/Output from previous day: 01/27 0701 - 01/28 0700 In: 2074 [I.V.:1800; IV Piggyback:250] Out: 1485 [Urine:1000; Drains:185; Blood:300] Intake/Output this shift:    Strength out of 5 wound is clean and dry  Lab Results: No results found for this basename: WBC:2,HGB:2,HCT:2,PLT:2 in the last 72 hours BMET No results found for this basename: NA:2,K:2,CL:2,CO2:2,GLUCOSE:2,BUN:2,CREATININE:2,CALCIUM:2 in the last 72 hours  Studies/Results: Dg Lumbar Spine 2-3 Views  05/04/2012  *RADIOLOGY REPORT*  Clinical Data: L4-5 PLIF  LUMBAR SPINE - 2-3 VIEW  Comparison: MRI 01/15/2011  Findings: Two images show discectomy at L4-5 with interbody fusion material and placement of bilateral pedicle screws at L4 and L5. Rods not yet positioned.  IMPRESSION: Discectomy and fusion in progress at L4-5.   Original Report Authenticated By: Paulina Fusi, M.D.     Assessment/Plan:  Progressive mobilization today possible discharge later today  LOS: 1 day     Louine Tenpenny P 05/05/2012, 7:22 AM

## 2012-05-06 MED ORDER — OXYCODONE-ACETAMINOPHEN 5-325 MG PO TABS: 1.0000 | ORAL_TABLET | ORAL | Status: AC | PRN

## 2012-05-06 MED ORDER — CYCLOBENZAPRINE HCL 10 MG PO TABS: 10.0000 mg | ORAL_TABLET | Freq: Three times a day (TID) | ORAL | Status: AC | PRN

## 2012-05-06 NOTE — Progress Notes (Signed)
Subjective: Patient reports Patient is doing great no leg pain back pain is well controlled on the pain medication and the muscle relaxer she is ready to go home  Objective: Vital signs in last 24 hours: Temp:  [98.3 F (36.8 C)-98.8 F (37.1 C)] 98.3 F (36.8 C) (01/29 0659) Pulse Rate:  [84-95] 95  (01/29 0659) Resp:  [18] 18  (01/29 0659) BP: (98-131)/(56-75) 98/56 mmHg (01/29 0659) SpO2:  [92 %-94 %] 92 % (01/29 0659)  Intake/Output from previous day: 01/28 0701 - 01/29 0700 In: -  Out: 65 [Drains:65] Intake/Output this shift: Total I/O In: 240 [P.O.:240] Out: -   Awake alert oriented strength 5 out of 5 wound is clean and dry  Lab Results: No results found for this basename: WBC:2,HGB:2,HCT:2,PLT:2 in the last 72 hours BMET No results found for this basename: NA:2,K:2,CL:2,CO2:2,GLUCOSE:2,BUN:2,CREATININE:2,CALCIUM:2 in the last 72 hours  Studies/Results: No results found.  Assessment/Plan: Discharge home  LOS: 2 days     Tanya Bates P 05/06/2012, 12:03 PM

## 2012-05-06 NOTE — Discharge Summary (Signed)
  Physician Discharge Summary  Patient ID: Tanya Bates MRN: [id number] DOB/AGE: [birth year] 67 y.o.  Admit date: 05/04/2012 Discharge date: 05/06/2012  Admission Diagnoses: Grade 1 spondylolisthesis L4-5 with lumbar instability  Discharge Diagnoses: Same Active Problems:  * No active hospital problems. *    Discharged Condition: good  Hospital Course: Patient is admitted hospital underwent an L4-5 decompression fusion postoperatively patient did very well went to recovery in the floor on the floor was convalescing well ambulating and voiding spontaneously tolerating a regular diet and was stable and be discharged home. She'll be scheduled followup approximately 1-2 weeks the  Consults: Significant Diagnostic Studies: Treatments: L4-5 lumbar fusion Discharge Exam: Blood pressure 98/56, pulse 95, temperature 98.3 F (36.8 C), temperature source Oral, resp. rate 18, height 5\' 1"  (1.549 m), weight 53.524 kg (118 lb), SpO2 92.00%. Strength out of 5 wound clean and dry  Disposition: Home     Medication List     As of 05/06/2012 12:05 PM    TAKE these medications         amLODipine 5 MG tablet   Commonly known as: NORVASC   Take 5 mg by mouth daily.      atorvastatin 20 MG tablet   Commonly known as: LIPITOR   Take 20 mg by mouth daily.      buPROPion 150 MG 12 hr tablet   Commonly known as: WELLBUTRIN SR   Take 150 mg by mouth 2 (two) times daily.      cyclobenzaprine 10 MG tablet   Commonly known as: FLEXERIL   Take 1 tablet (10 mg total) by mouth 3 (three) times daily as needed for muscle spasms.      olmesartan-hydrochlorothiazide 40-12.5 MG per tablet   Commonly known as: BENICAR HCT   Take 1 tablet by mouth daily.      oxyCODONE-acetaminophen 5-325 MG per tablet   Commonly known as: PERCOCET/ROXICET   Take 1-2 tablets by mouth every 4 (four) hours as needed.      pregabalin 75 MG capsule   Commonly known as: LYRICA   Take 75 mg by mouth 2 (two) times  daily.      PRESCRIPTION MEDICATION   as needed. 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  Dr Charlsie Merles office at the [facility] on [address]      VITAMIN B 12 PO   Take 1 tablet by mouth daily.      VITAMIN D PO   Take 1 tablet by mouth daily.         Signed: Calee Nugent P 05/06/2012, 12:05 PM

## 2012-05-06 NOTE — Care Management Note (Signed)
    Page 1 of 1   05/06/2012     1:28:14 PM   CARE MANAGEMENT NOTE 05/06/2012  Patient:  Tanya Bates, Tanya Bates   Account Number:  [id number]  Date Initiated:  05/04/2012  Documentation initiated by:  [facility]  Subjective/Objective Assessment:   admitted postop L4-5 PLIF     Action/Plan:   PT/OT evals-no follow up recommended   Anticipated DC Date:  05/07/2012   Anticipated DC Plan:  HOME/SELF CARE      DC Planning Services  CM consult      Choice offered to / List presented to:             Status of service:  Completed, signed off Medicare Important Message given?   (If response is "NO", the following Medicare IM given date fields will be blank) Date Medicare IM given:   Date Additional Medicare IM given:    Discharge Disposition:  HOME/SELF CARE  Per UR Regulation:  Reviewed for med. necessity/level of care/duration of stay  If discussed at Long Length of Stay Meetings, dates discussed:    Comments:

## 2012-07-20 ENCOUNTER — Other Ambulatory Visit (HOSPITAL_COMMUNITY): Payer: Self-pay | Admitting: Obstetrics and Gynecology

## 2012-07-20 DIAGNOSIS — Z1231 Encounter for screening mammogram for malignant neoplasm of breast: Secondary | ICD-10-CM

## 2012-07-27 ENCOUNTER — Ambulatory Visit (HOSPITAL_COMMUNITY)
Admission: RE | Admit: 2012-07-27 | Discharge: 2012-07-27 | Disposition: A | Discharge status: Home / Self Care | Payer: Medicare Other | Source: Ambulatory Visit | Attending: Obstetrics and Gynecology | Admitting: Obstetrics and Gynecology

## 2012-07-27 DIAGNOSIS — Z1231 Encounter for screening mammogram for malignant neoplasm of breast: Secondary | ICD-10-CM

## 2013-05-13 ENCOUNTER — Ambulatory Visit: Payer: Self-pay | Admitting: Podiatry

## 2013-07-26 ENCOUNTER — Other Ambulatory Visit (HOSPITAL_COMMUNITY): Payer: Self-pay | Admitting: Obstetrics and Gynecology

## 2013-07-26 DIAGNOSIS — Z1231 Encounter for screening mammogram for malignant neoplasm of breast: Secondary | ICD-10-CM

## 2013-07-30 ENCOUNTER — Ambulatory Visit (HOSPITAL_COMMUNITY)
Admission: RE | Admit: 2013-07-30 | Discharge: 2013-07-30 | Disposition: A | Discharge status: Home / Self Care | Payer: Medicare Other | Source: Ambulatory Visit | Attending: Obstetrics and Gynecology | Admitting: Obstetrics and Gynecology

## 2013-07-30 DIAGNOSIS — Z1231 Encounter for screening mammogram for malignant neoplasm of breast: Secondary | ICD-10-CM

## 2014-08-12 ENCOUNTER — Other Ambulatory Visit (HOSPITAL_COMMUNITY): Payer: Self-pay | Admitting: General Practice

## 2014-08-18 ENCOUNTER — Other Ambulatory Visit (HOSPITAL_COMMUNITY): Payer: Self-pay | Admitting: Internal Medicine

## 2014-08-18 DIAGNOSIS — Z1231 Encounter for screening mammogram for malignant neoplasm of breast: Secondary | ICD-10-CM

## 2014-08-19 ENCOUNTER — Encounter: Payer: Self-pay | Admitting: Podiatry

## 2014-08-19 ENCOUNTER — Ambulatory Visit (INDEPENDENT_AMBULATORY_CARE_PROVIDER_SITE_OTHER): Payer: PPO

## 2014-08-19 ENCOUNTER — Ambulatory Visit (INDEPENDENT_AMBULATORY_CARE_PROVIDER_SITE_OTHER): Payer: PPO | Admitting: Podiatry

## 2014-08-19 VITALS — BP 92/58 | HR 83 | Resp 11 | Ht 61.0 in | Wt 106.0 lb

## 2014-08-19 DIAGNOSIS — M779 Enthesopathy, unspecified: Secondary | ICD-10-CM

## 2014-08-19 DIAGNOSIS — M2011 Hallux valgus (acquired), right foot: Secondary | ICD-10-CM

## 2014-08-19 DIAGNOSIS — M2041 Other hammer toe(s) (acquired), right foot: Secondary | ICD-10-CM

## 2014-08-19 DIAGNOSIS — M21611 Bunion of right foot: Secondary | ICD-10-CM

## 2014-08-19 MED ORDER — TRIAMCINOLONE ACETONIDE 10 MG/ML IJ SUSP
10.0000 mg | Freq: Once | INTRAMUSCULAR | Status: AC
  Administered 2014-08-19: 10 mg

## 2014-08-19 NOTE — Progress Notes (Signed)
   Subjective:    Patient ID: Tanya Bates, female    DOB: 09/14/1945, 69 y.o.   MRN: [id number]  HPI    Review of Systems  All other systems reviewed and are negative.      Objective:   Physical Exam        Assessment & Plan:

## 2014-08-21 NOTE — Progress Notes (Signed)
Subjective:     Patient ID: Tanya Bates, female   DOB: 09/27/1945, 69 y.o.   MRN: [id number]  HPI patient states I have a hammertoe in the bunion with the toe bothering me more as time goes on and at times it makes it difficult for me to wear shoe gear comfortably   Review of Systems  All other systems reviewed and are negative.      Objective:   Physical Exam  Constitutional: She is oriented to person, place, and time.  Cardiovascular: Intact distal pulses.   Musculoskeletal: Normal range of motion.  Neurological: She is oriented to person, place, and time.  Skin: Skin is warm.  Nursing note and vitals reviewed.  neurovascular status intact with muscle strength adequate range of motion within normal limits. Patient's noted to have an elevated second toe right with mild rigid contracture and a structural bunion with the hallux pressing against the second toe. 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 Careful interphalangeal injection accomplished to milligrams Dexon 2 mg Xylocaine and padding applied. Discussed long-term surgical intervention

## 2014-08-24 DIAGNOSIS — M79673 Pain in unspecified foot: Secondary | ICD-10-CM

## 2014-08-26 ENCOUNTER — Ambulatory Visit (HOSPITAL_COMMUNITY)
Admission: RE | Admit: 2014-08-26 | Discharge: 2014-08-26 | Disposition: A | Discharge status: Home / Self Care | Payer: PPO | Source: Ambulatory Visit | Attending: Internal Medicine | Admitting: Internal Medicine

## 2014-08-26 DIAGNOSIS — Z1231 Encounter for screening mammogram for malignant neoplasm of breast: Secondary | ICD-10-CM | POA: Insufficient documentation

## 2015-08-11 ENCOUNTER — Other Ambulatory Visit: Payer: Self-pay

## 2015-08-11 DIAGNOSIS — Z1231 Encounter for screening mammogram for malignant neoplasm of breast: Secondary | ICD-10-CM

## 2015-09-01 ENCOUNTER — Ambulatory Visit
Admission: RE | Admit: 2015-09-01 | Discharge: 2015-09-01 | Disposition: A | Discharge status: Home / Self Care | Payer: PPO | Source: Ambulatory Visit

## 2015-09-01 DIAGNOSIS — Z1231 Encounter for screening mammogram for malignant neoplasm of breast: Secondary | ICD-10-CM

## 2015-09-05 ENCOUNTER — Other Ambulatory Visit: Payer: Self-pay | Admitting: Internal Medicine

## 2015-09-05 DIAGNOSIS — R928 Other abnormal and inconclusive findings on diagnostic imaging of breast: Secondary | ICD-10-CM

## 2015-09-18 ENCOUNTER — Ambulatory Visit
Admission: RE | Admit: 2015-09-18 | Discharge: 2015-09-18 | Disposition: A | Discharge status: Home / Self Care | Payer: PPO | Source: Ambulatory Visit | Attending: Internal Medicine | Admitting: Internal Medicine

## 2015-09-18 ENCOUNTER — Other Ambulatory Visit: Payer: Self-pay | Admitting: Internal Medicine

## 2015-09-18 DIAGNOSIS — R928 Other abnormal and inconclusive findings on diagnostic imaging of breast: Secondary | ICD-10-CM

## 2015-09-18 DIAGNOSIS — N6310 Unspecified lump in the right breast, unspecified quadrant: Secondary | ICD-10-CM

## 2015-09-20 ENCOUNTER — Other Ambulatory Visit: Payer: Self-pay | Admitting: Internal Medicine

## 2015-09-20 DIAGNOSIS — N6310 Unspecified lump in the right breast, unspecified quadrant: Secondary | ICD-10-CM

## 2015-09-21 ENCOUNTER — Ambulatory Visit
Admission: RE | Admit: 2015-09-21 | Discharge: 2015-09-21 | Disposition: A | Discharge status: Home / Self Care | Payer: PPO | Source: Ambulatory Visit | Attending: Internal Medicine | Admitting: Internal Medicine

## 2015-09-21 DIAGNOSIS — N6310 Unspecified lump in the right breast, unspecified quadrant: Secondary | ICD-10-CM

## 2016-06-07 ENCOUNTER — Other Ambulatory Visit: Payer: Self-pay | Admitting: Internal Medicine

## 2016-06-07 ENCOUNTER — Ambulatory Visit
Admission: RE | Admit: 2016-06-07 | Discharge: 2016-06-07 | Disposition: A | Discharge status: Home / Self Care | Payer: PPO | Source: Ambulatory Visit | Attending: Internal Medicine | Admitting: Internal Medicine

## 2016-06-07 DIAGNOSIS — T1490XA Injury, unspecified, initial encounter: Secondary | ICD-10-CM

## 2016-08-16 ENCOUNTER — Other Ambulatory Visit: Payer: Self-pay | Admitting: Internal Medicine

## 2016-08-16 ENCOUNTER — Ambulatory Visit
Admission: RE | Admit: 2016-08-16 | Discharge: 2016-08-16 | Disposition: A | Discharge status: Home / Self Care | Payer: Medicare Other | Source: Ambulatory Visit | Attending: Internal Medicine | Admitting: Internal Medicine

## 2016-08-16 DIAGNOSIS — M25552 Pain in left hip: Secondary | ICD-10-CM

## 2017-03-08 DEATH — deceased

## 2017-12-09 IMAGING — CR DG LUMBAR SPINE 2-3V
3 series · 3 of 3 positions shown · non-contrast
Comparison: Prior MRI from 07/07/2015.

CLINICAL DATA: Initial evaluation for recent fall, injury, now with
the posterior pain radiating to left groin. History of prior L4-5
fusion.

EXAM:
LUMBAR SPINE - 2-3 VIEW

[t l-spine a.p.]
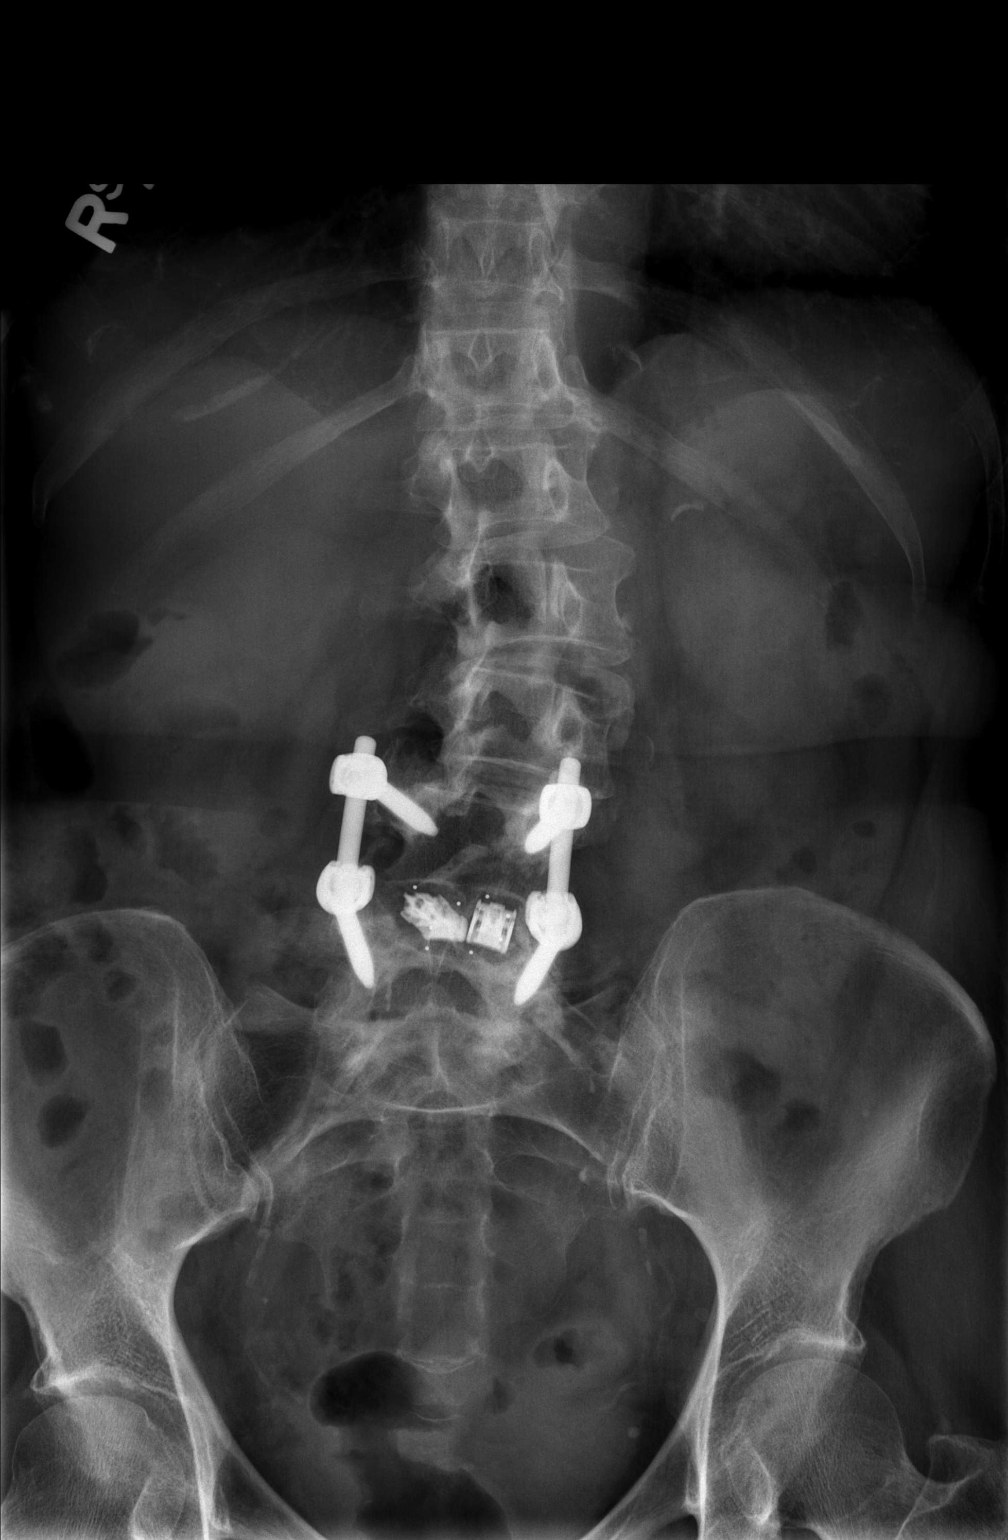

[t l-spine lat]
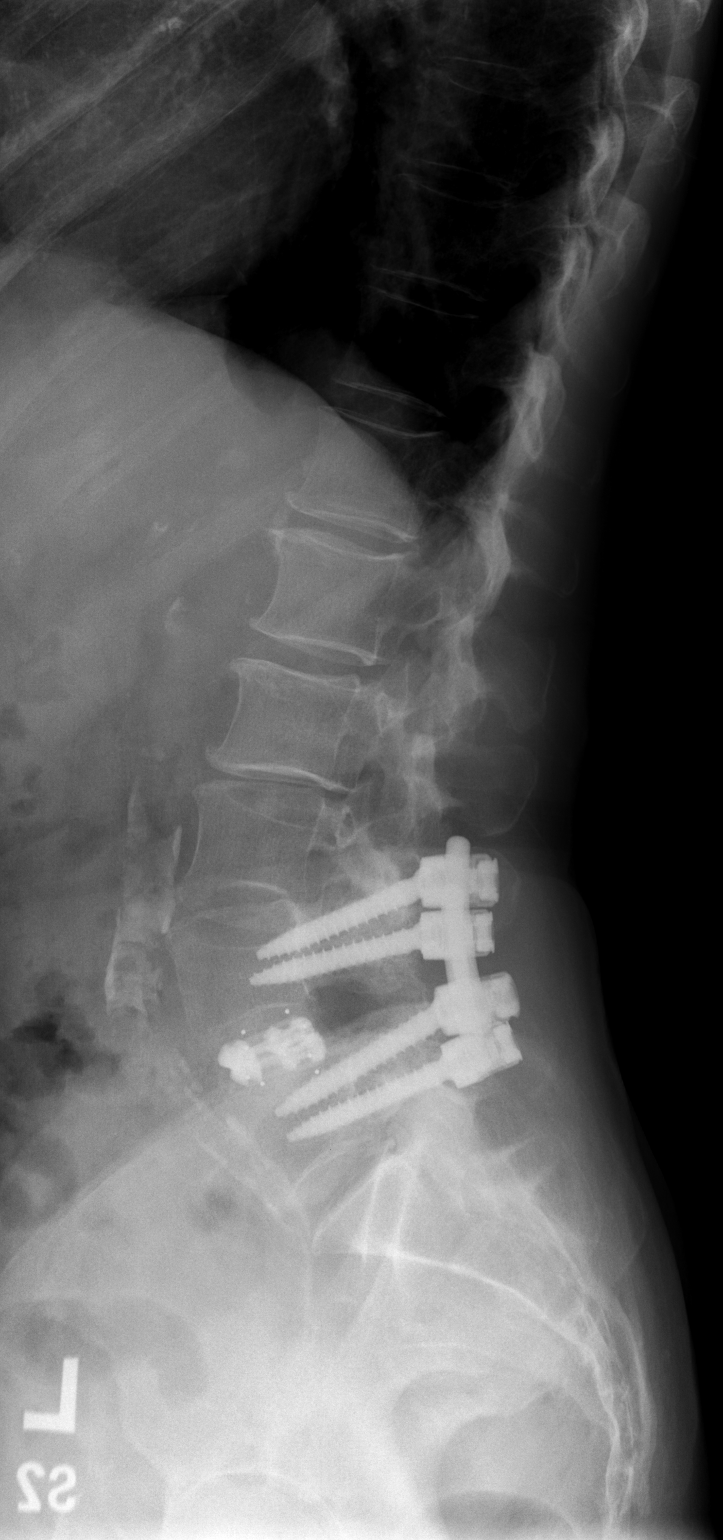

[t l-spine l5-s1 spot]
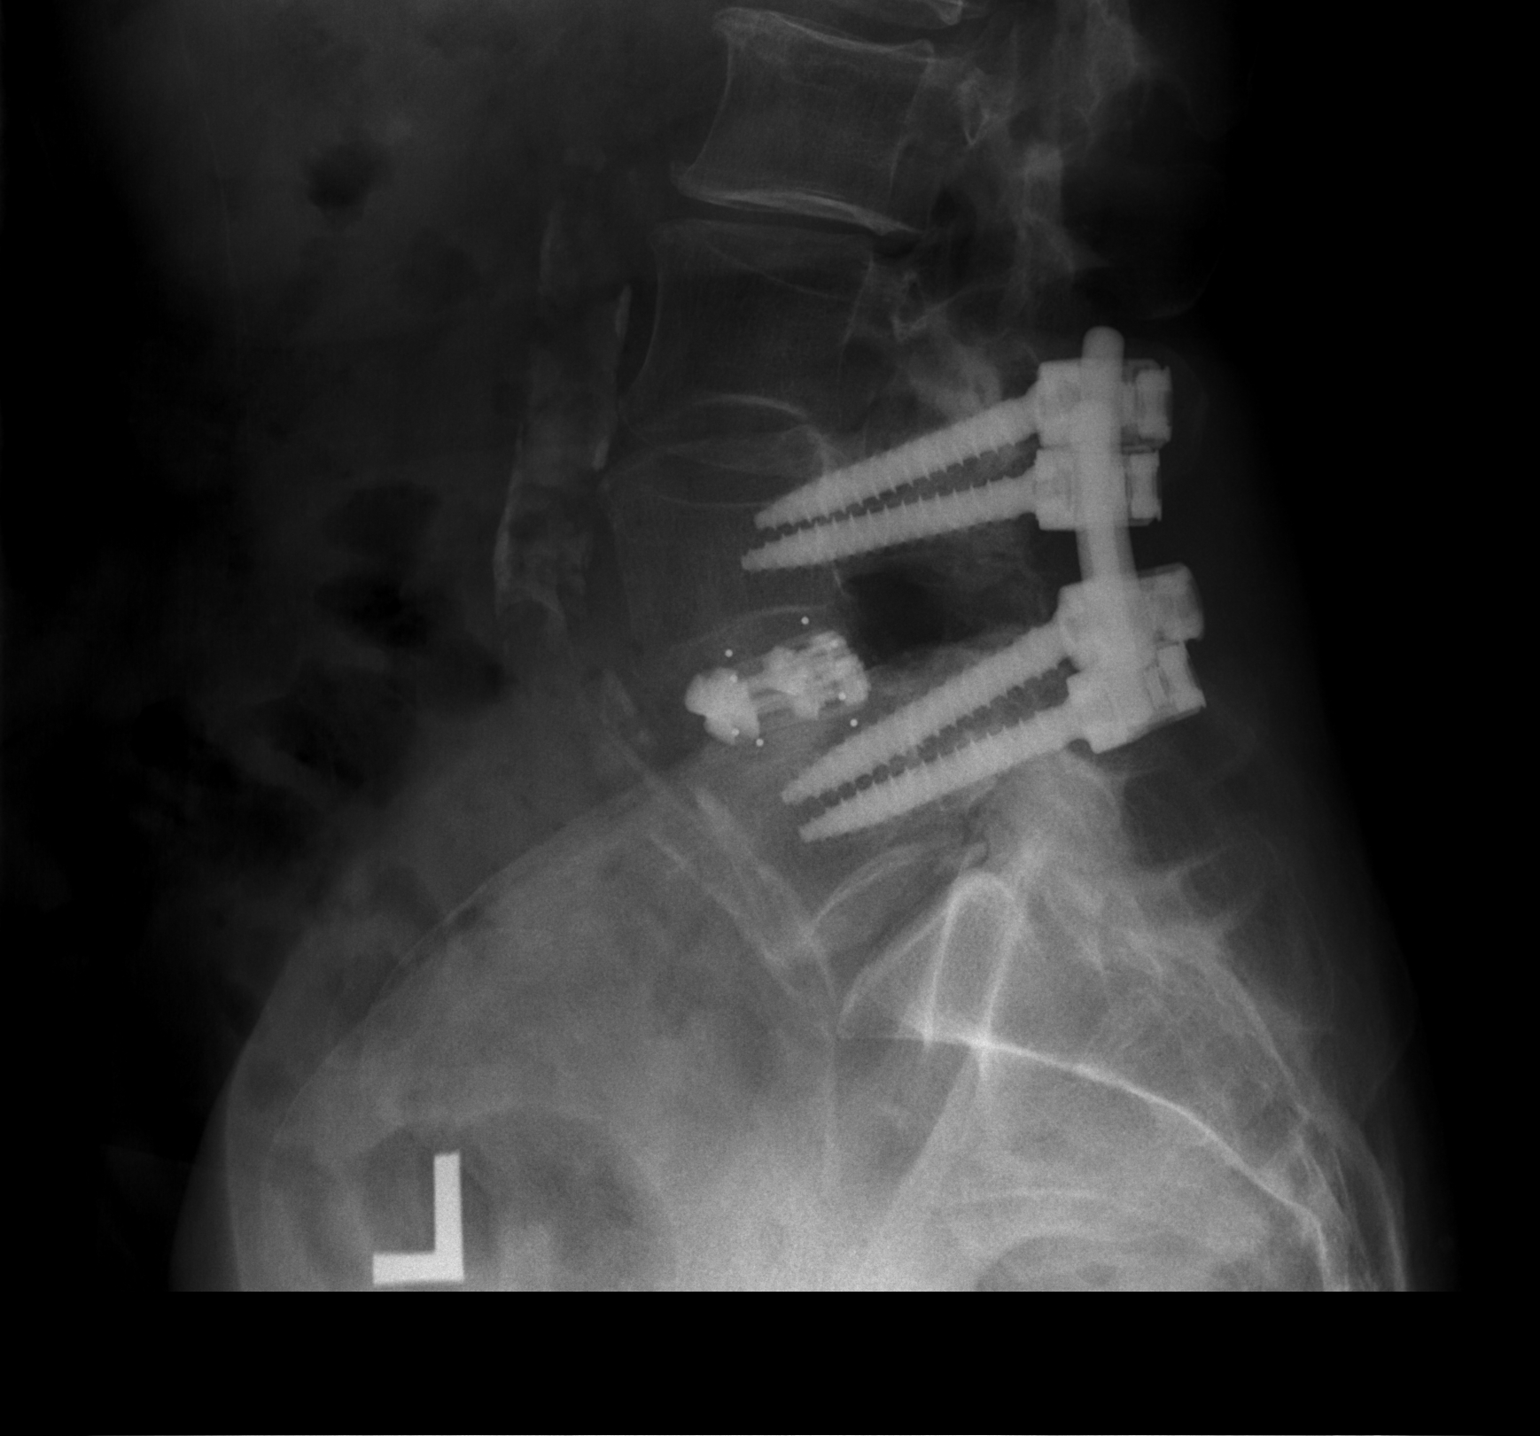

[3 of 3 positions shown; findings below may reference images not displayed]

FINDINGS: Levoscoliosis with apex at L2-3. Vertebral bodies otherwise normally
aligned with preservation of the normal lumbar lordosis. Vertebral
body heights maintained. No evidence for acute or chronic fracture.
Patient is status post posterior fusion at L4-5. Interbody device in
place at L4-5 as well. Hardware intact without complication.

SI joints approximated and symmetric.  Visualized sacrum intact.

No soft tissue abnormality. Prominent aorto bi-iliac atherosclerotic
cyst noted.
IMPRESSION: 1. No radiographic evidence for acute abnormality within the lumbar
spine.
2. Levoscoliosis with sequelae of prior lumbar fusion at L4-5. No
hardware complication.
3. Prominent aorto bi-iliac atherosclerosis.

## 2018-02-17 IMAGING — CR DG HIP (WITH OR WITHOUT PELVIS) 2-3V*L*
2 series · 2 of 2 positions shown · non-contrast
Comparison: None.

CLINICAL DATA: Anterior left hip pain following fall several months
ago, initial encounter

EXAM:
DG HIP (WITH OR WITHOUT PELVIS) 2V LEFT

[w pelvis *]
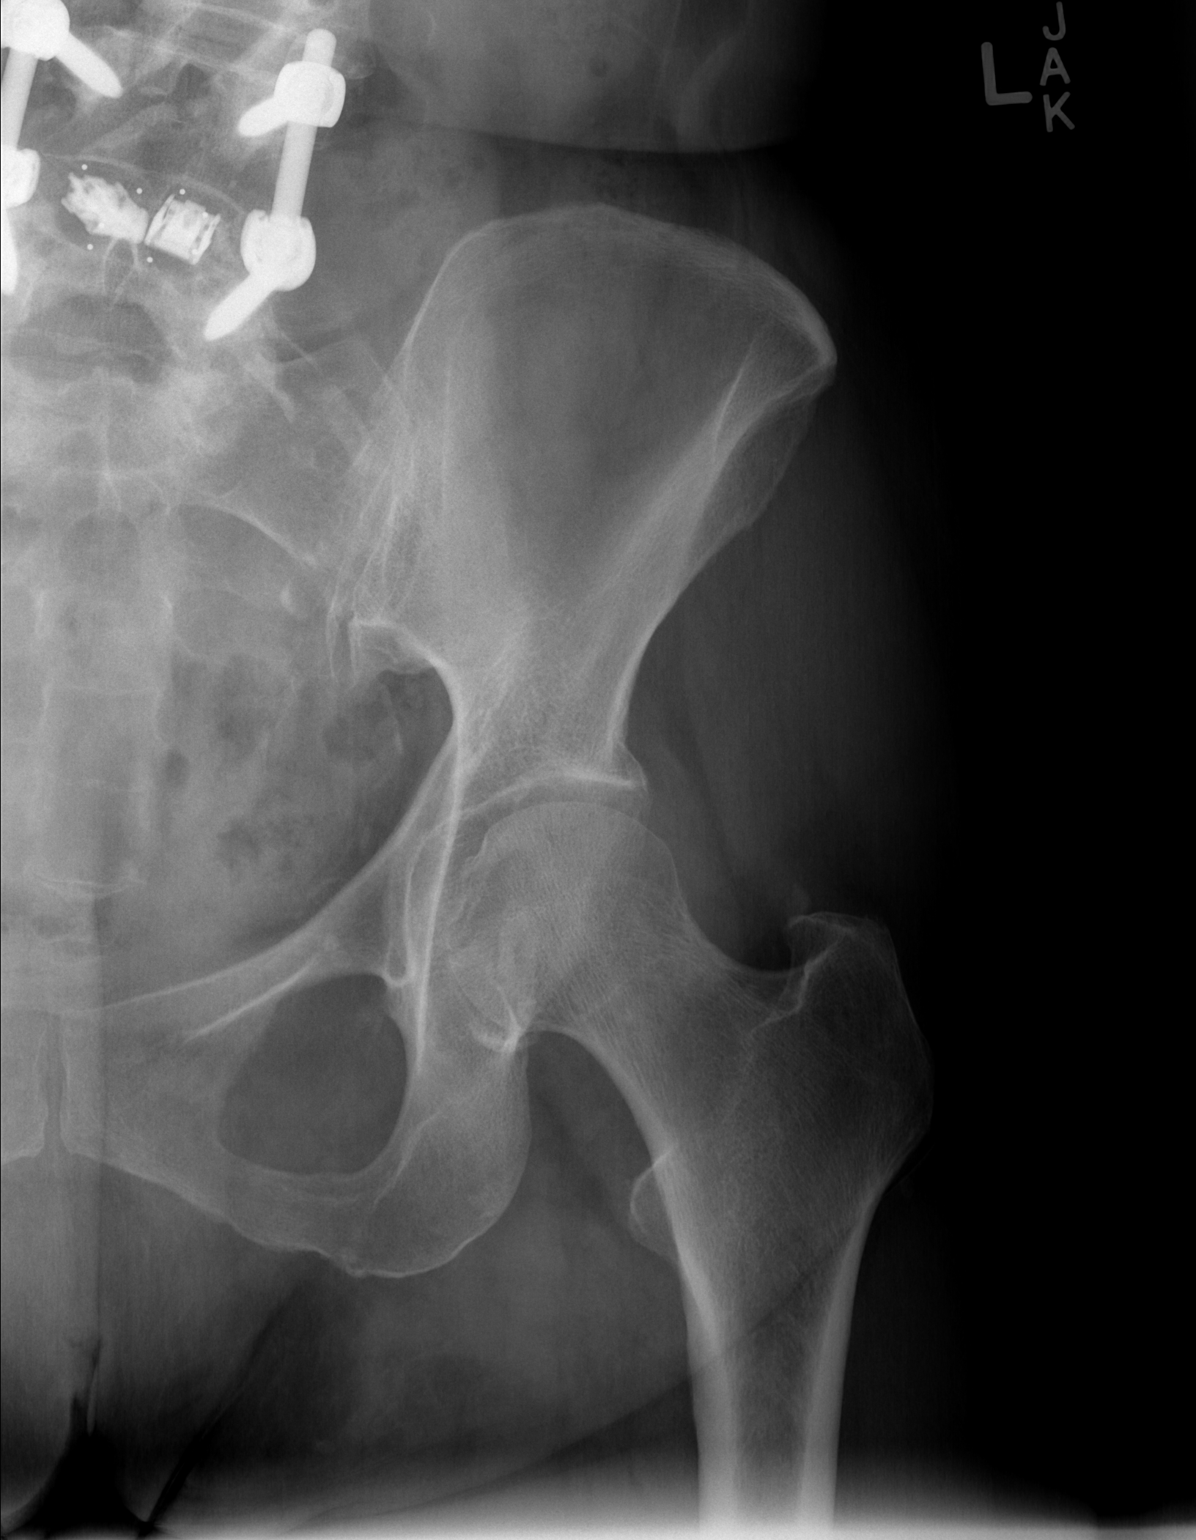

[t hip ap left]
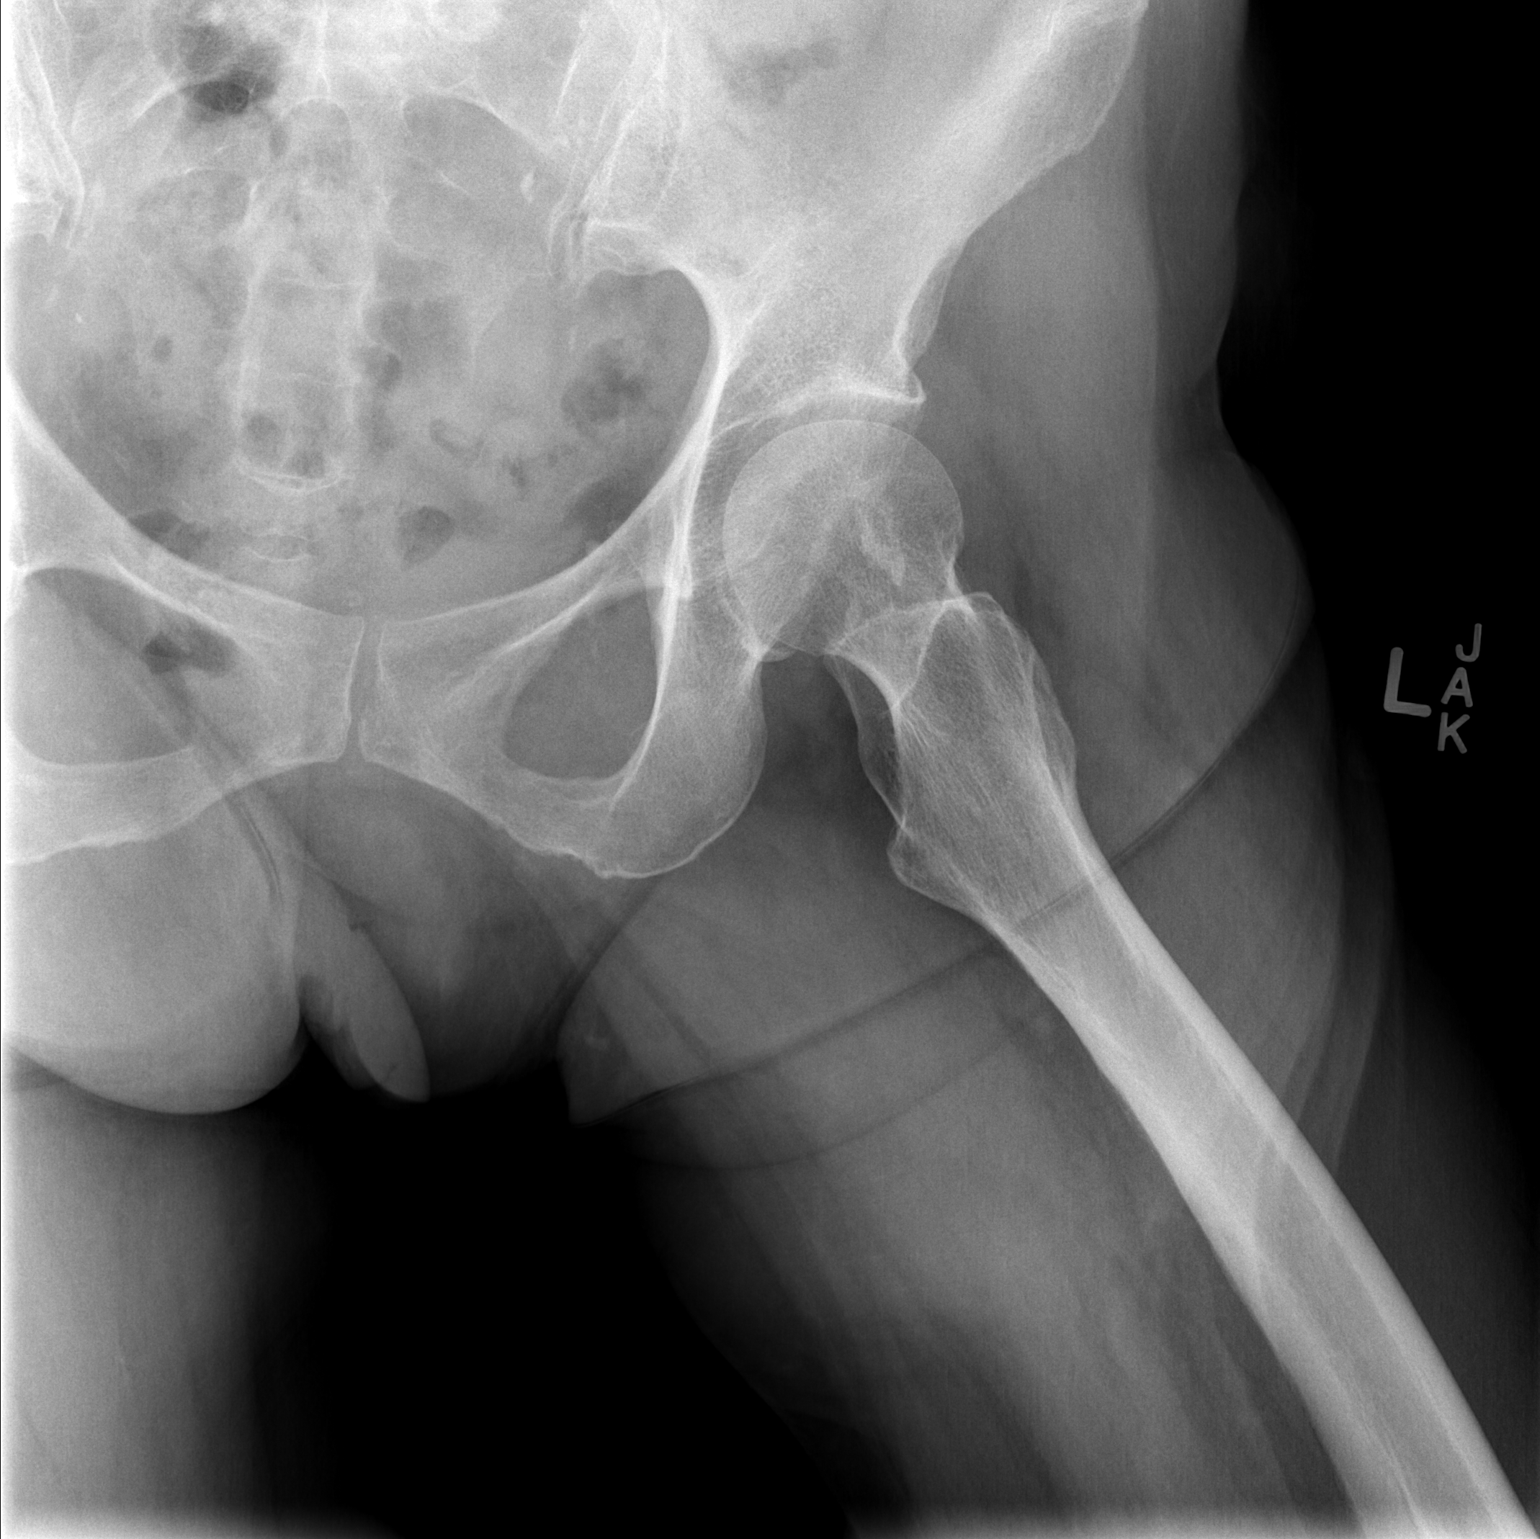

[2 of 2 positions shown; findings below may reference images not displayed]

FINDINGS: The pelvic ring as visualized is within normal limits. Mild
degenerative changes of left hip joint are seen. No acute fracture
or dislocation is noted. Postsurgical changes in the lower lumbar
spine are noted.
IMPRESSION: Mild degenerative change without acute abnormality.
